# Patient Record
Sex: Male | Born: 1950
Health system: Southern US, Community
[De-identification: ages and names within clinical notes are randomized; demographics above are authoritative.]

## PROBLEM LIST (undated history)

## (undated) DIAGNOSIS — E7800 Pure hypercholesterolemia, unspecified: Secondary | ICD-10-CM

## (undated) DIAGNOSIS — R609 Edema, unspecified: Secondary | ICD-10-CM

## (undated) DIAGNOSIS — M1710 Unilateral primary osteoarthritis, unspecified knee: Secondary | ICD-10-CM

## (undated) DIAGNOSIS — R109 Unspecified abdominal pain: Secondary | ICD-10-CM

## (undated) DIAGNOSIS — K219 Gastro-esophageal reflux disease without esophagitis: Secondary | ICD-10-CM

## (undated) DIAGNOSIS — M545 Low back pain, unspecified: Secondary | ICD-10-CM

## (undated) DIAGNOSIS — K644 Residual hemorrhoidal skin tags: Secondary | ICD-10-CM

## (undated) DIAGNOSIS — K6389 Other specified diseases of intestine: Secondary | ICD-10-CM

## (undated) DIAGNOSIS — Z9989 Dependence on other enabling machines and devices: Principal | ICD-10-CM

## (undated) DIAGNOSIS — N400 Enlarged prostate without lower urinary tract symptoms: Secondary | ICD-10-CM

## (undated) DIAGNOSIS — I8310 Varicose veins of unspecified lower extremity with inflammation: Secondary | ICD-10-CM

## (undated) DIAGNOSIS — K5730 Diverticulosis of large intestine without perforation or abscess without bleeding: Secondary | ICD-10-CM

## (undated) DIAGNOSIS — IMO0002 Reserved for concepts with insufficient information to code with codable children: Secondary | ICD-10-CM

## (undated) DIAGNOSIS — L259 Unspecified contact dermatitis, unspecified cause: Secondary | ICD-10-CM

## (undated) DIAGNOSIS — G4730 Sleep apnea, unspecified: Secondary | ICD-10-CM

## (undated) DIAGNOSIS — E119 Type 2 diabetes mellitus without complications: Secondary | ICD-10-CM

## (undated) DIAGNOSIS — I10 Essential (primary) hypertension: Secondary | ICD-10-CM

## (undated) DIAGNOSIS — K589 Irritable bowel syndrome without diarrhea: Secondary | ICD-10-CM

## (undated) DIAGNOSIS — J309 Allergic rhinitis, unspecified: Secondary | ICD-10-CM

## (undated) DIAGNOSIS — F411 Generalized anxiety disorder: Secondary | ICD-10-CM

## (undated) DIAGNOSIS — G4733 Obstructive sleep apnea (adult) (pediatric): Principal | ICD-10-CM

## (undated) HISTORY — DX: Reserved for concepts with insufficient information to code with codable children: IMO0002

## (undated) HISTORY — DX: Benign prostatic hyperplasia without lower urinary tract symptoms: N40.0

## (undated) HISTORY — DX: Other specified diseases of intestine: K63.89

## (undated) HISTORY — DX: Essential (primary) hypertension: I10

## (undated) HISTORY — PX: LUMBAR LAMINECTOMY: SHX95

## (undated) HISTORY — DX: Obstructive sleep apnea (adult) (pediatric): G47.33

## (undated) HISTORY — DX: Dependence on other enabling machines and devices: Z99.89

## (undated) HISTORY — DX: Allergic rhinitis, unspecified: J30.9

## (undated) HISTORY — DX: Unspecified contact dermatitis, unspecified cause: L25.9

## (undated) HISTORY — DX: Edema, unspecified: R60.9

## (undated) HISTORY — DX: Sleep apnea, unspecified: G47.30

## (undated) HISTORY — DX: Residual hemorrhoidal skin tags: K64.4

## (undated) HISTORY — DX: Pure hypercholesterolemia, unspecified: E78.00

## (undated) HISTORY — DX: Varicose veins of unspecified lower extremity with inflammation: I83.10

## (undated) HISTORY — DX: Unilateral primary osteoarthritis, unspecified knee: M17.10

## (undated) HISTORY — DX: Generalized anxiety disorder: F41.1

## (undated) HISTORY — DX: Gastro-esophageal reflux disease without esophagitis: K21.9

## (undated) HISTORY — DX: Low back pain, unspecified: M54.50

## (undated) HISTORY — PX: APPENDECTOMY: SHX54

## (undated) HISTORY — DX: Low back pain: M54.5

## (undated) HISTORY — DX: Irritable bowel syndrome without diarrhea: K58.9

## (undated) HISTORY — DX: Type 2 diabetes mellitus without complications: E11.9

## (undated) HISTORY — DX: Diverticulosis of large intestine without perforation or abscess without bleeding: K57.30

## (undated) HISTORY — DX: Unspecified abdominal pain: R10.9

---

## 1982-01-29 HISTORY — PX: KNEE ARTHROSCOPY: SUR90

## 2003-01-30 HISTORY — PX: COLONOSCOPY W/ POLYPECTOMY: SHX1380

## 2005-10-08 ENCOUNTER — Encounter: Admission: RE | Admit: 2005-10-08 | Discharge: 2005-10-08 | Payer: Self-pay | Admitting: Orthopedic Surgery

## 2005-11-12 ENCOUNTER — Encounter: Admission: RE | Admit: 2005-11-12 | Discharge: 2005-11-12 | Payer: Self-pay | Admitting: Family Medicine

## 2012-09-26 ENCOUNTER — Encounter: Payer: Self-pay | Admitting: Neurology

## 2012-09-30 ENCOUNTER — Encounter: Payer: Self-pay | Admitting: Neurology

## 2012-09-30 ENCOUNTER — Ambulatory Visit (INDEPENDENT_AMBULATORY_CARE_PROVIDER_SITE_OTHER): Payer: Federal, State, Local not specified - PPO | Admitting: Neurology

## 2012-09-30 VITALS — BP 131/76 | HR 79 | Temp 98.5°F | Ht 73.0 in | Wt 297.0 lb

## 2012-09-30 DIAGNOSIS — G2581 Restless legs syndrome: Secondary | ICD-10-CM

## 2012-09-30 DIAGNOSIS — G4733 Obstructive sleep apnea (adult) (pediatric): Secondary | ICD-10-CM

## 2012-09-30 HISTORY — DX: Obstructive sleep apnea (adult) (pediatric): G47.33

## 2012-09-30 NOTE — Patient Instructions (Addendum)
Please continue using your CPAP regularly. While your insurance requires that you use CPAP at least 4 hours each night on 70% of the nights, I recommend, that you not skip any nights and use it throughout the night if you can. Getting used to CPAP does take time and patience and discipline. Untreated obstructive sleep apnea when it is moderate to severe can have an adverse impact on cardiovascular health and raise her risk for heart disease, arrhythmias, hypertension, congestive heart failure, stroke and diabetes. Untreated obstructive sleep apnea causes sleep disruption, nonrestorative sleep, and sleep deprivation. This can have an impact on your day to day functioning and cause daytime sleepiness and impairment of cognitive function, memory loss, mood disturbance, and problems focussing. Using CPAP regularly can improve these symptoms.  Based on your symptoms, I would recommend a repeat sleep study with a diagnostic portion in the beginning, followed by CPAP.

## 2012-09-30 NOTE — Progress Notes (Signed)
Subjective:    Patient ID: Ethan Greene is a 62 y.o. male.  HPI  Huston Foley, MD, PhD [facility] [address], [address] [address] [place], [place] [address]  Dear Harrold Donath,   I saw your patient, Ethan Greene, upon your kind request in my neurologic clinic today for initial consultation of his sleep disorder, in particular his prior diagnosis of obstructive sleep apnea. The patient is accompanied by his wife today. As you know, Ethan Greene is a very pleasant 62 year old right-handed gentleman with an underlying medical history of allergic rhinitis, reflux disease, osteoarthritis, diabetes type 2, anxiety, diverticulosis, irritable bowel syndrome, hypertension, colonic polyps, lower back pain and hyperlipidemia as well as a prior diagnosis of obstructive sleep apnea who needs reevaluation for a new machine. I reviewed his sleep study which you kindly included in your referral from 02/16/2004 which was conducted at [facility]. His RDI was 71 per hour, worse in REM sleep at 81 per hour, his mean oxygen saturation was 93.6% and O2 nadir was 68.7% and REM sleep. While I do not see a CPAP titration study report, he has been established on CPAP since then through [facility]. He did not bring his machine and I do not have compliance date for review, but he indicates using CPAP regularly. In the past 6-12 months he has had worsening daytime tiredness and non-restorative sleep. He uses medium nasal pillows, Swift FX and his pressure is 10 cm, per wife.  His typical bedtime is reported to be around 9 PM and usual wake time is around 5:30 AM. Sometimes he wakes up around 3 AM and cannot go back to sleep. Sleep onset typically occurs within 15-30 minutes. He reports feeling adequately rested upon awakening. He wakes up on an average 0 to 1 times in the middle of the night and has to go to the bathroom 0 to 1 times on a typical night. He admits to occasional morning headaches.  His wife has noticed some recent forgetfulness. He has not had any problems performing at work. He works day shift. 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 He reports excessive daytime somnolence (EDS) and His Epworth Sleepiness Score (ESS) is 11/24 today. He has not fallen asleep while driving. The patient has not been taking a planned nap, but in the last 3-6 months, has been napping on the weekend. He denies dreaming in a nap and reports feeling refreshed after a nap.  He has been known to snore for the past many years. Snoring is reportedly marked, and associated with choking sounds and witnessed apneas, but not so much with CPAP. The patient admits to a sense of choking or strangling feeling without CPAP. There is no report of nighttime reflux, with no nighttime cough experienced. The patient has noted some RLS symptoms and is known to kick while asleep or before falling asleep. He has significant arthritis pain in his R shoulder, L hip and knees, R more than L. Because of his arthritis pain he prefers to sleep on the left side. There is family history of OSA in his son and also suspected in his father as he was a loud snorer.  He is a restless sleeper and in the morning, the bed is quite disheveled.   He denies cataplexy, sleep paralysis, hypnagogic or hypnopompic hallucinations, or sleep attacks. He does report some vivid dreams, and rare nightmares, but no dream enactments, or parasomnias, such as sleep talking or sleep  walking.   He consumes 1 caffeinated beverage per day, usually in the form of coffee in the morning.  His bedroom is usually dark and cool. There is TV in the bedroom and usually it is not on at night. His wife turns it off after he is asleep.   His Past Medical History Is Significant For: Past Medical History  Diagnosis Date  . Contact dermatitis and other eczema, due to unspecified cause   . Edema   . Allergic rhinitis, cause unspecified   . Esophageal reflux    . Abdominal pain, unspecified site   . Osteoarthrosis, unspecified whether generalized or localized, lower leg   . Varicose veins of lower extremities with inflammation   . Type II or unspecified type diabetes mellitus without mention of complication, not stated as uncontrolled   . Diverticulosis of colon (without mention of hemorrhage)   . Anxiety state, unspecified   . External hemorrhoids without mention of complication   . Irritable bowel syndrome   . Essential hypertension, benign   . Other specified disorder of intestines   . Unspecified sleep apnea   . Hypertrophy of prostate without urinary obstruction and other lower urinary tract symptoms (LUTS)   . Lumbago   . Pure hypercholesterolemia   . OSA on CPAP 09/30/2012    His Past Surgical History Is Significant For: Past Surgical History  Procedure Laterality Date  . Knee arthroscopy Right 1984  . Lumbar laminectomy  1992 & 1996  . Colonoscopy w/ polypectomy  2005  . Appendectomy      His Family History Is Significant For: Family History  Problem Relation Age of Onset  . Cancer Father   . Cancer Mother     His Social History Is Significant For: History   Social History  . Marital Status: Single    Spouse Name: N/A    Number of Children: N/A  . Years of Education: N/A   Social History Main Topics  . Smoking status: Former Smoker    Types: Cigarettes    Quit date: 09/27/2007  . Smokeless tobacco: None  . Alcohol Use: 1.5 oz/week    3 drink(s) per week  . Drug Use: No  . Sexual Activity: None   Other Topics Concern  . None   Social History Narrative  . None    His Allergies Are:  Allergies  Allergen Reactions  . Aspirin   :   His Current Medications Are:  Outpatient Encounter Prescriptions as of 09/30/2012  Medication Sig Dispense Refill  . atorvastatin (LIPITOR) 10 MG tablet Take 10 mg by mouth daily.      . fish oil-omega-3 fatty acids 1000 MG capsule Take 1 g by mouth daily.      . fluocinonide  ointment (LIDEX) 0.05 % Apply 1 application topically 2 (two) times daily.      . hydrochlorothiazide (HYDRODIURIL) 25 MG tablet Take 25 mg by mouth daily.      Marland Kitchen HYDROcodone-acetaminophen (VICODIN) 5-500 MG per tablet Take 1 tablet by mouth every 6 (six) hours as needed for pain.      Marland Kitchen lisinopril (PRINIVIL,ZESTRIL) 5 MG tablet Take 2.5 mg by mouth daily.      . methocarbamol (ROBAXIN) 500 MG tablet Take 500 mg by mouth 4 (four) times daily.      . pimecrolimus (ELIDEL) 1 % cream Apply 1 application topically 2 (two) times daily.      . tamsulosin (FLOMAX) 0.4 MG CAPS capsule Take 0.4 mg by mouth daily.      Marland Kitchen  cyclobenzaprine (FLEXERIL) 10 MG tablet Take 10 mg by mouth 3 (three) times daily as needed for muscle spasms.       No facility-administered encounter medications on file as of 09/30/2012.    Review of Systems  Constitutional: Positive for fatigue and unexpected weight change.  HENT: Positive for hearing loss.   Respiratory:       Snoring  Musculoskeletal: Positive for arthralgias.  Neurological: Positive for headaches.       Memory loss  Psychiatric/Behavioral: Positive for sleep disturbance. The patient is nervous/anxious.     Objective:  Neurologic Exam  Physical Exam Physical Examination:   Filed Vitals:   09/30/12 0847  BP: 131/76  Pulse: 79  Temp: 98.5 F (36.9 C)    General Examination: The patient is a very pleasant 62 y.o. male in no acute distress. He appears well-developed and well-nourished and well groomed. He is obese.  HEENT: Normocephalic, atraumatic, pupils are equal, round and reactive to light and accommodation. Funduscopic exam is normal with sharp disc margins noted. Extraocular tracking is good without limitation to gaze excursion or nystagmus noted. Normal smooth pursuit is noted. Hearing is grossly intact but he reports hearing loss and he actually has hearing aids but does not typically use them. Tympanic membranes are clear bilaterally. Face is  symmetric with normal facial animation and normal facial sensation. Speech is clear with no dysarthria noted. There is no hypophonia. There is no lip, neck/head, jaw or voice tremor. Neck is supple with full range of passive and active motion. There are no carotid bruits on auscultation. Oropharynx exam reveals: mild mouth dryness, adequate dental hygiene and moderate airway crowding, due to narrow airway, larger tongue and tonsillar size of 1+. Mallampati is class II. Tongue protrudes centrally and palate elevates symmetrically. Neck size is 18.25 inches.   Chest: Clear to auscultation without wheezing, rhonchi or crackles noted.  Heart: S1+S2+0, regular and normal without murmurs, rubs or gallops noted.   Abdomen: Soft, non-tender and non-distended with normal bowel sounds appreciated on auscultation.  Extremities: There is trace pitting edema in the distal right lower extremity. Pedal pulses are intact.  Skin: Warm and dry without trophic changes noted. There are no varicose veins.  Musculoskeletal: exam reveals no obvious joint deformities, tenderness or joint swelling or erythema.   Neurologically:  Mental status: The patient is awake, alert and oriented in all 4 spheres. His memory, attention, language and knowledge are appropriate. There is no aphasia, agnosia, apraxia or anomia. Speech is clear with normal prosody and enunciation. Thought process is linear. Mood is congruent and affect is normal.  Cranial nerves are as described above under HEENT exam. In addition, shoulder shrug is normal with equal shoulder height noted. Motor exam: Normal bulk, strength and tone is noted. He has some pain limitation of his range of motion in the right shoulder. There is no drift, tremor or rebound. Romberg is negative. Reflexes are 2+ throughout, with the exception of trace in his right knee. This knee was operated on. Fine motor skills are intact with normal finger taps, normal hand movements, normal  rapid alternating patting, normal foot taps and normal foot agility.  Cerebellar testing shows no dysmetria or intention tremor on finger to nose testing. Heel to shin is unremarkable bilaterally. There is no truncal or gait ataxia.  Sensory exam is intact to light touch, pinprick, vibration, temperature sense and proprioception in the upper and lower extremities.  Gait, station and balance are unremarkable. No veering to one  side is noted. No leaning to one side is noted. Posture is age-appropriate and stance is narrow based. No problems turning are noted. He turns en bloc. Tandem walk is unremarkable. Intact toe and heel stance is noted.               Assessment and Plan:   In summary, Ethan Greene is a very pleasant 62 y.o.-year old male with a history of obstructive sleep apnea on CPAP. His sleep study was about 8 years ago. He is reportedly on a pressure of 10 cm but reports recurrence of nonrestorative sleep and daytime tiredness in the past 6-12 months. He does indicate some weight gain by the way, in the realm of 5-6 pounds perhaps since the last sleep study. Since it has been several years since he was last evaluated I would like to go ahead and proceed with a split-night sleep study to reassess the severity of his OSA and also to re-titrate his CPAP. He was in agreement. He understands the importance of treating severe obstructive sleep apnea in particular, to reduce cardiovascular risk factors. I will see him back after his sleep study is completed. If there is a change in this setting or the mask we will go ahead and make those changes in his prescription. His current CPAP machine is over 49 years old. He may be eligible for a new machine as well. I explained the importance of being compliant with PAP treatment, not only for insurance purposes but primarily for the patient's long term health benefit. I answered all their questions today and the patient and his wife were in agreement.  Thank you  very much for allowing me to participate in the care of this nice patient. If I can be of any further assistance to you please do not hesitate to call me at [phone].  Sincerely,   Huston Foley, MD, PhD

## 2012-10-15 ENCOUNTER — Ambulatory Visit (INDEPENDENT_AMBULATORY_CARE_PROVIDER_SITE_OTHER): Payer: Federal, State, Local not specified - PPO | Admitting: Neurology

## 2012-10-15 DIAGNOSIS — R9431 Abnormal electrocardiogram [ECG] [EKG]: Secondary | ICD-10-CM

## 2012-10-15 DIAGNOSIS — G4733 Obstructive sleep apnea (adult) (pediatric): Secondary | ICD-10-CM

## 2012-10-16 ENCOUNTER — Telehealth: Payer: Self-pay | Admitting: *Deleted

## 2012-10-16 NOTE — Telephone Encounter (Signed)
Spoke to patient's spouse as patient was not at home.  I reassured her that I had reviewed his test and we did see enough sleep for valuable information.  During the first half of the test he slept for 135 minutes, during the second half of the test, he slept 247 minutes.  I explained that he experienced an inordinate amount of stage one sleep during the first half of the test and explained that may contribute to his perception of no sleep.  I also let her know he experienced a good degree of wake during both parts of the test and that can contribute to feeling like one hasn't slept at all.  Asked her to have him call me if he had further questions/concerns.

## 2012-10-16 NOTE — Telephone Encounter (Signed)
Message copied by Daryll Drown on Thu Oct 16, 2012 10:19 AM ------      Message from: [employer]      Created: Thu Oct 16, 2012 10:06 AM      Regarding: Return call       Pt had a sleep study last night and had very little to no sleep at all due to noise.  Please return call to Mrs. Trowbridge asap.  She would like to know what this means for him. [phone]       ------

## 2012-10-29 ENCOUNTER — Telehealth: Payer: Self-pay | Admitting: Neurology

## 2012-10-29 DIAGNOSIS — G4733 Obstructive sleep apnea (adult) (pediatric): Secondary | ICD-10-CM

## 2012-10-29 NOTE — Telephone Encounter (Signed)
Please call and inform patient that I have entered an order for treatment with CPAP. We will arrange for a machine through his DME company and I will see the patient back in follow-up in about 6-8 weeks. I will be looking out for compliance data downloaded from the machine at the time of the followup appointment and discuss how it is going with PAP treatment at the time of the visit. Please also make sure, the patient has a follow-up appointment with me in 6-8 weeks from the setup date, thanks.   His original pressure was 10 cm, but he did very well on 8 cm for [place] and my new order for CPAP is for 8 cm. He should be able to get a new machine.   Huston Foley, MD, PhD [facility])

## 2012-10-30 ENCOUNTER — Encounter: Payer: Self-pay | Admitting: *Deleted

## 2012-10-30 NOTE — Telephone Encounter (Signed)
Left message for patient regarding sleep study results, asked patient to call me back to discuss results and have questions answered.  Explained that a copy of the sleep study was sent to referring physician and copy of study is coming to them in the mail. Gave detailed message that Dr. Frances Furbish felt like patient needed different settings so orders would be sent to his home care equipment company [employer] [facility] and he should hear from them soon.  Copy of report faxed to Lonie Peak PA-C. Explained that a follow up appt was needed 6-8 weeks after he received new CPAP or new settings in order to assess that he is doing well at this pressure and using equipment with compliance. A copy of the sleep study interpretive report as well as a letter with info regarding contact info for the DME company, the importance of CPAP compliance, and the need for patient to call and set the follow up appointment in 6-8 weeks after starting CPAP will be mailed to the patient's home.

## 2014-12-17 ENCOUNTER — Other Ambulatory Visit: Payer: Self-pay | Admitting: Urology

## 2014-12-17 DIAGNOSIS — N281 Cyst of kidney, acquired: Secondary | ICD-10-CM

## 2014-12-28 ENCOUNTER — Ambulatory Visit (HOSPITAL_COMMUNITY)
Admission: RE | Admit: 2014-12-28 | Discharge: 2014-12-28 | Disposition: A | Discharge status: Home / Self Care | Payer: Federal, State, Local not specified - PPO | Source: Ambulatory Visit | Attending: Urology | Admitting: Urology

## 2014-12-28 DIAGNOSIS — I700 Atherosclerosis of aorta: Secondary | ICD-10-CM | POA: Insufficient documentation

## 2014-12-28 DIAGNOSIS — I714 Abdominal aortic aneurysm, without rupture: Secondary | ICD-10-CM | POA: Insufficient documentation

## 2014-12-28 DIAGNOSIS — N281 Cyst of kidney, acquired: Secondary | ICD-10-CM

## 2014-12-28 DIAGNOSIS — K760 Fatty (change of) liver, not elsewhere classified: Secondary | ICD-10-CM | POA: Insufficient documentation

## 2014-12-28 MED ORDER — GADOBENATE DIMEGLUMINE 529 MG/ML IV SOLN
20.0000 mL | Freq: Once | INTRAVENOUS | Status: AC | PRN
  Administered 2014-12-28: 20 mL via INTRAVENOUS

## 2016-05-04 ENCOUNTER — Other Ambulatory Visit: Payer: Self-pay | Admitting: Physician Assistant

## 2016-05-04 DIAGNOSIS — R748 Abnormal levels of other serum enzymes: Secondary | ICD-10-CM

## 2016-05-11 ENCOUNTER — Ambulatory Visit
Admission: RE | Admit: 2016-05-11 | Discharge: 2016-05-11 | Disposition: A | Discharge status: Home / Self Care | Payer: Federal, State, Local not specified - PPO | Source: Ambulatory Visit | Attending: Physician Assistant | Admitting: Physician Assistant

## 2016-05-11 DIAGNOSIS — R748 Abnormal levels of other serum enzymes: Secondary | ICD-10-CM

## 2016-10-31 DIAGNOSIS — M546 Pain in thoracic spine: Secondary | ICD-10-CM | POA: Diagnosis not present

## 2016-10-31 DIAGNOSIS — M62830 Muscle spasm of back: Secondary | ICD-10-CM | POA: Diagnosis not present

## 2016-10-31 DIAGNOSIS — M5413 Radiculopathy, cervicothoracic region: Secondary | ICD-10-CM | POA: Diagnosis not present

## 2016-10-31 DIAGNOSIS — M9901 Segmental and somatic dysfunction of cervical region: Secondary | ICD-10-CM | POA: Diagnosis not present

## 2016-10-31 DIAGNOSIS — M5124 Other intervertebral disc displacement, thoracic region: Secondary | ICD-10-CM | POA: Diagnosis not present

## 2016-10-31 DIAGNOSIS — M9902 Segmental and somatic dysfunction of thoracic region: Secondary | ICD-10-CM | POA: Diagnosis not present

## 2016-10-31 DIAGNOSIS — M50323 Other cervical disc degeneration at C6-C7 level: Secondary | ICD-10-CM | POA: Diagnosis not present

## 2016-11-22 DIAGNOSIS — E7800 Pure hypercholesterolemia, unspecified: Secondary | ICD-10-CM | POA: Diagnosis not present

## 2016-11-22 DIAGNOSIS — Z9181 History of falling: Secondary | ICD-10-CM | POA: Diagnosis not present

## 2016-11-22 DIAGNOSIS — I10 Essential (primary) hypertension: Secondary | ICD-10-CM | POA: Diagnosis not present

## 2016-11-22 DIAGNOSIS — E119 Type 2 diabetes mellitus without complications: Secondary | ICD-10-CM | POA: Diagnosis not present

## 2016-11-22 DIAGNOSIS — Z1389 Encounter for screening for other disorder: Secondary | ICD-10-CM | POA: Diagnosis not present

## 2016-11-22 DIAGNOSIS — N400 Enlarged prostate without lower urinary tract symptoms: Secondary | ICD-10-CM | POA: Diagnosis not present

## 2016-11-22 DIAGNOSIS — M5416 Radiculopathy, lumbar region: Secondary | ICD-10-CM | POA: Diagnosis not present

## 2016-11-22 DIAGNOSIS — Z79899 Other long term (current) drug therapy: Secondary | ICD-10-CM | POA: Diagnosis not present

## 2016-12-13 DIAGNOSIS — M5413 Radiculopathy, cervicothoracic region: Secondary | ICD-10-CM | POA: Diagnosis not present

## 2016-12-13 DIAGNOSIS — M62830 Muscle spasm of back: Secondary | ICD-10-CM | POA: Diagnosis not present

## 2016-12-13 DIAGNOSIS — M50323 Other cervical disc degeneration at C6-C7 level: Secondary | ICD-10-CM | POA: Diagnosis not present

## 2016-12-13 DIAGNOSIS — M5124 Other intervertebral disc displacement, thoracic region: Secondary | ICD-10-CM | POA: Diagnosis not present

## 2016-12-13 DIAGNOSIS — M9902 Segmental and somatic dysfunction of thoracic region: Secondary | ICD-10-CM | POA: Diagnosis not present

## 2016-12-13 DIAGNOSIS — M546 Pain in thoracic spine: Secondary | ICD-10-CM | POA: Diagnosis not present

## 2016-12-13 DIAGNOSIS — M9901 Segmental and somatic dysfunction of cervical region: Secondary | ICD-10-CM | POA: Diagnosis not present

## 2016-12-31 DIAGNOSIS — J069 Acute upper respiratory infection, unspecified: Secondary | ICD-10-CM | POA: Diagnosis not present

## 2017-01-16 DIAGNOSIS — M9901 Segmental and somatic dysfunction of cervical region: Secondary | ICD-10-CM | POA: Diagnosis not present

## 2017-01-16 DIAGNOSIS — M9902 Segmental and somatic dysfunction of thoracic region: Secondary | ICD-10-CM | POA: Diagnosis not present

## 2017-01-16 DIAGNOSIS — M5413 Radiculopathy, cervicothoracic region: Secondary | ICD-10-CM | POA: Diagnosis not present

## 2017-01-16 DIAGNOSIS — M5124 Other intervertebral disc displacement, thoracic region: Secondary | ICD-10-CM | POA: Diagnosis not present

## 2017-01-16 DIAGNOSIS — M546 Pain in thoracic spine: Secondary | ICD-10-CM | POA: Diagnosis not present

## 2017-01-16 DIAGNOSIS — M62830 Muscle spasm of back: Secondary | ICD-10-CM | POA: Diagnosis not present

## 2017-01-16 DIAGNOSIS — M50323 Other cervical disc degeneration at C6-C7 level: Secondary | ICD-10-CM | POA: Diagnosis not present

## 2017-02-14 DIAGNOSIS — R351 Nocturia: Secondary | ICD-10-CM | POA: Diagnosis not present

## 2017-02-14 DIAGNOSIS — N281 Cyst of kidney, acquired: Secondary | ICD-10-CM | POA: Diagnosis not present

## 2017-02-14 DIAGNOSIS — R310 Gross hematuria: Secondary | ICD-10-CM | POA: Diagnosis not present

## 2017-02-14 DIAGNOSIS — N401 Enlarged prostate with lower urinary tract symptoms: Secondary | ICD-10-CM | POA: Diagnosis not present

## 2017-02-20 DIAGNOSIS — M5413 Radiculopathy, cervicothoracic region: Secondary | ICD-10-CM | POA: Diagnosis not present

## 2017-02-20 DIAGNOSIS — M9901 Segmental and somatic dysfunction of cervical region: Secondary | ICD-10-CM | POA: Diagnosis not present

## 2017-02-20 DIAGNOSIS — M5124 Other intervertebral disc displacement, thoracic region: Secondary | ICD-10-CM | POA: Diagnosis not present

## 2017-02-20 DIAGNOSIS — M62830 Muscle spasm of back: Secondary | ICD-10-CM | POA: Diagnosis not present

## 2017-02-20 DIAGNOSIS — M50323 Other cervical disc degeneration at C6-C7 level: Secondary | ICD-10-CM | POA: Diagnosis not present

## 2017-02-20 DIAGNOSIS — M546 Pain in thoracic spine: Secondary | ICD-10-CM | POA: Diagnosis not present

## 2017-02-20 DIAGNOSIS — M9902 Segmental and somatic dysfunction of thoracic region: Secondary | ICD-10-CM | POA: Diagnosis not present

## 2017-02-27 DIAGNOSIS — Z23 Encounter for immunization: Secondary | ICD-10-CM | POA: Diagnosis not present

## 2017-02-27 DIAGNOSIS — Z1331 Encounter for screening for depression: Secondary | ICD-10-CM | POA: Diagnosis not present

## 2017-02-27 DIAGNOSIS — E119 Type 2 diabetes mellitus without complications: Secondary | ICD-10-CM | POA: Diagnosis not present

## 2017-02-27 DIAGNOSIS — E7800 Pure hypercholesterolemia, unspecified: Secondary | ICD-10-CM | POA: Diagnosis not present

## 2017-02-27 DIAGNOSIS — M5416 Radiculopathy, lumbar region: Secondary | ICD-10-CM | POA: Diagnosis not present

## 2017-02-27 DIAGNOSIS — Z9181 History of falling: Secondary | ICD-10-CM | POA: Diagnosis not present

## 2017-02-27 DIAGNOSIS — I10 Essential (primary) hypertension: Secondary | ICD-10-CM | POA: Diagnosis not present

## 2017-02-27 DIAGNOSIS — N400 Enlarged prostate without lower urinary tract symptoms: Secondary | ICD-10-CM | POA: Diagnosis not present

## 2017-03-05 DIAGNOSIS — Z7984 Long term (current) use of oral hypoglycemic drugs: Secondary | ICD-10-CM | POA: Diagnosis not present

## 2017-03-05 DIAGNOSIS — H2513 Age-related nuclear cataract, bilateral: Secondary | ICD-10-CM | POA: Diagnosis not present

## 2017-03-05 DIAGNOSIS — E119 Type 2 diabetes mellitus without complications: Secondary | ICD-10-CM | POA: Diagnosis not present

## 2017-03-21 DIAGNOSIS — M62830 Muscle spasm of back: Secondary | ICD-10-CM | POA: Diagnosis not present

## 2017-03-21 DIAGNOSIS — M9902 Segmental and somatic dysfunction of thoracic region: Secondary | ICD-10-CM | POA: Diagnosis not present

## 2017-03-21 DIAGNOSIS — M50323 Other cervical disc degeneration at C6-C7 level: Secondary | ICD-10-CM | POA: Diagnosis not present

## 2017-03-21 DIAGNOSIS — M5124 Other intervertebral disc displacement, thoracic region: Secondary | ICD-10-CM | POA: Diagnosis not present

## 2017-03-21 DIAGNOSIS — M5413 Radiculopathy, cervicothoracic region: Secondary | ICD-10-CM | POA: Diagnosis not present

## 2017-03-21 DIAGNOSIS — M9901 Segmental and somatic dysfunction of cervical region: Secondary | ICD-10-CM | POA: Diagnosis not present

## 2017-03-21 DIAGNOSIS — M546 Pain in thoracic spine: Secondary | ICD-10-CM | POA: Diagnosis not present

## 2017-04-22 DIAGNOSIS — M50323 Other cervical disc degeneration at C6-C7 level: Secondary | ICD-10-CM | POA: Diagnosis not present

## 2017-04-22 DIAGNOSIS — M5124 Other intervertebral disc displacement, thoracic region: Secondary | ICD-10-CM | POA: Diagnosis not present

## 2017-04-22 DIAGNOSIS — M546 Pain in thoracic spine: Secondary | ICD-10-CM | POA: Diagnosis not present

## 2017-04-22 DIAGNOSIS — M9901 Segmental and somatic dysfunction of cervical region: Secondary | ICD-10-CM | POA: Diagnosis not present

## 2017-04-22 DIAGNOSIS — M9902 Segmental and somatic dysfunction of thoracic region: Secondary | ICD-10-CM | POA: Diagnosis not present

## 2017-04-22 DIAGNOSIS — M5413 Radiculopathy, cervicothoracic region: Secondary | ICD-10-CM | POA: Diagnosis not present

## 2017-04-22 DIAGNOSIS — M62830 Muscle spasm of back: Secondary | ICD-10-CM | POA: Diagnosis not present

## 2017-05-29 DIAGNOSIS — E119 Type 2 diabetes mellitus without complications: Secondary | ICD-10-CM | POA: Diagnosis not present

## 2017-05-29 DIAGNOSIS — Z79899 Other long term (current) drug therapy: Secondary | ICD-10-CM | POA: Diagnosis not present

## 2017-05-29 DIAGNOSIS — N400 Enlarged prostate without lower urinary tract symptoms: Secondary | ICD-10-CM | POA: Diagnosis not present

## 2017-05-29 DIAGNOSIS — E7800 Pure hypercholesterolemia, unspecified: Secondary | ICD-10-CM | POA: Diagnosis not present

## 2017-05-29 DIAGNOSIS — I10 Essential (primary) hypertension: Secondary | ICD-10-CM | POA: Diagnosis not present

## 2017-05-31 DIAGNOSIS — M5413 Radiculopathy, cervicothoracic region: Secondary | ICD-10-CM | POA: Diagnosis not present

## 2017-05-31 DIAGNOSIS — M50323 Other cervical disc degeneration at C6-C7 level: Secondary | ICD-10-CM | POA: Diagnosis not present

## 2017-05-31 DIAGNOSIS — M5124 Other intervertebral disc displacement, thoracic region: Secondary | ICD-10-CM | POA: Diagnosis not present

## 2017-05-31 DIAGNOSIS — M546 Pain in thoracic spine: Secondary | ICD-10-CM | POA: Diagnosis not present

## 2017-05-31 DIAGNOSIS — M9901 Segmental and somatic dysfunction of cervical region: Secondary | ICD-10-CM | POA: Diagnosis not present

## 2017-05-31 DIAGNOSIS — M62830 Muscle spasm of back: Secondary | ICD-10-CM | POA: Diagnosis not present

## 2017-05-31 DIAGNOSIS — M9902 Segmental and somatic dysfunction of thoracic region: Secondary | ICD-10-CM | POA: Diagnosis not present

## 2017-07-10 DIAGNOSIS — M9902 Segmental and somatic dysfunction of thoracic region: Secondary | ICD-10-CM | POA: Diagnosis not present

## 2017-07-10 DIAGNOSIS — M9901 Segmental and somatic dysfunction of cervical region: Secondary | ICD-10-CM | POA: Diagnosis not present

## 2017-07-10 DIAGNOSIS — M5124 Other intervertebral disc displacement, thoracic region: Secondary | ICD-10-CM | POA: Diagnosis not present

## 2017-07-10 DIAGNOSIS — M546 Pain in thoracic spine: Secondary | ICD-10-CM | POA: Diagnosis not present

## 2017-07-10 DIAGNOSIS — M5413 Radiculopathy, cervicothoracic region: Secondary | ICD-10-CM | POA: Diagnosis not present

## 2017-07-10 DIAGNOSIS — M62830 Muscle spasm of back: Secondary | ICD-10-CM | POA: Diagnosis not present

## 2017-07-10 DIAGNOSIS — M50323 Other cervical disc degeneration at C6-C7 level: Secondary | ICD-10-CM | POA: Diagnosis not present

## 2017-08-15 DIAGNOSIS — M62830 Muscle spasm of back: Secondary | ICD-10-CM | POA: Diagnosis not present

## 2017-08-15 DIAGNOSIS — M5124 Other intervertebral disc displacement, thoracic region: Secondary | ICD-10-CM | POA: Diagnosis not present

## 2017-08-15 DIAGNOSIS — M50323 Other cervical disc degeneration at C6-C7 level: Secondary | ICD-10-CM | POA: Diagnosis not present

## 2017-08-15 DIAGNOSIS — M9902 Segmental and somatic dysfunction of thoracic region: Secondary | ICD-10-CM | POA: Diagnosis not present

## 2017-08-15 DIAGNOSIS — M9901 Segmental and somatic dysfunction of cervical region: Secondary | ICD-10-CM | POA: Diagnosis not present

## 2017-08-15 DIAGNOSIS — M546 Pain in thoracic spine: Secondary | ICD-10-CM | POA: Diagnosis not present

## 2017-08-15 DIAGNOSIS — M5413 Radiculopathy, cervicothoracic region: Secondary | ICD-10-CM | POA: Diagnosis not present

## 2017-08-19 DIAGNOSIS — R310 Gross hematuria: Secondary | ICD-10-CM | POA: Diagnosis not present

## 2017-08-19 DIAGNOSIS — N401 Enlarged prostate with lower urinary tract symptoms: Secondary | ICD-10-CM | POA: Diagnosis not present

## 2017-08-19 DIAGNOSIS — R351 Nocturia: Secondary | ICD-10-CM | POA: Diagnosis not present

## 2017-08-29 DIAGNOSIS — E119 Type 2 diabetes mellitus without complications: Secondary | ICD-10-CM | POA: Diagnosis not present

## 2017-08-29 DIAGNOSIS — Z1339 Encounter for screening examination for other mental health and behavioral disorders: Secondary | ICD-10-CM | POA: Diagnosis not present

## 2017-08-29 DIAGNOSIS — I10 Essential (primary) hypertension: Secondary | ICD-10-CM | POA: Diagnosis not present

## 2017-08-29 DIAGNOSIS — M5416 Radiculopathy, lumbar region: Secondary | ICD-10-CM | POA: Diagnosis not present

## 2017-08-29 DIAGNOSIS — E7800 Pure hypercholesterolemia, unspecified: Secondary | ICD-10-CM | POA: Diagnosis not present

## 2017-08-29 DIAGNOSIS — Z79899 Other long term (current) drug therapy: Secondary | ICD-10-CM | POA: Diagnosis not present

## 2017-09-11 DIAGNOSIS — M5124 Other intervertebral disc displacement, thoracic region: Secondary | ICD-10-CM | POA: Diagnosis not present

## 2017-09-11 DIAGNOSIS — M50323 Other cervical disc degeneration at C6-C7 level: Secondary | ICD-10-CM | POA: Diagnosis not present

## 2017-09-11 DIAGNOSIS — M9901 Segmental and somatic dysfunction of cervical region: Secondary | ICD-10-CM | POA: Diagnosis not present

## 2017-09-11 DIAGNOSIS — M546 Pain in thoracic spine: Secondary | ICD-10-CM | POA: Diagnosis not present

## 2017-09-11 DIAGNOSIS — M9902 Segmental and somatic dysfunction of thoracic region: Secondary | ICD-10-CM | POA: Diagnosis not present

## 2017-09-11 DIAGNOSIS — M62830 Muscle spasm of back: Secondary | ICD-10-CM | POA: Diagnosis not present

## 2017-09-11 DIAGNOSIS — M5413 Radiculopathy, cervicothoracic region: Secondary | ICD-10-CM | POA: Diagnosis not present

## 2017-09-27 DIAGNOSIS — G4733 Obstructive sleep apnea (adult) (pediatric): Secondary | ICD-10-CM | POA: Diagnosis not present

## 2017-10-14 DIAGNOSIS — S0990XA Unspecified injury of head, initial encounter: Secondary | ICD-10-CM | POA: Diagnosis not present

## 2017-10-14 DIAGNOSIS — Z7984 Long term (current) use of oral hypoglycemic drugs: Secondary | ICD-10-CM | POA: Diagnosis not present

## 2017-10-14 DIAGNOSIS — S199XXA Unspecified injury of neck, initial encounter: Secondary | ICD-10-CM | POA: Diagnosis not present

## 2017-10-14 DIAGNOSIS — E119 Type 2 diabetes mellitus without complications: Secondary | ICD-10-CM | POA: Diagnosis not present

## 2017-10-14 DIAGNOSIS — M1990 Unspecified osteoarthritis, unspecified site: Secondary | ICD-10-CM | POA: Diagnosis not present

## 2017-10-14 DIAGNOSIS — S42032A Displaced fracture of lateral end of left clavicle, initial encounter for closed fracture: Secondary | ICD-10-CM | POA: Diagnosis not present

## 2017-10-14 DIAGNOSIS — Z79899 Other long term (current) drug therapy: Secondary | ICD-10-CM | POA: Diagnosis not present

## 2017-10-14 DIAGNOSIS — S42035A Nondisplaced fracture of lateral end of left clavicle, initial encounter for closed fracture: Secondary | ICD-10-CM | POA: Diagnosis not present

## 2017-10-14 DIAGNOSIS — I10 Essential (primary) hypertension: Secondary | ICD-10-CM | POA: Diagnosis not present

## 2017-10-14 DIAGNOSIS — S0003XA Contusion of scalp, initial encounter: Secondary | ICD-10-CM | POA: Diagnosis not present

## 2017-10-14 DIAGNOSIS — Z982 Presence of cerebrospinal fluid drainage device: Secondary | ICD-10-CM | POA: Diagnosis not present

## 2017-10-16 DIAGNOSIS — S42032A Displaced fracture of lateral end of left clavicle, initial encounter for closed fracture: Secondary | ICD-10-CM | POA: Diagnosis not present

## 2017-10-30 DIAGNOSIS — L918 Other hypertrophic disorders of the skin: Secondary | ICD-10-CM | POA: Diagnosis not present

## 2017-11-13 DIAGNOSIS — S42002D Fracture of unspecified part of left clavicle, subsequent encounter for fracture with routine healing: Secondary | ICD-10-CM | POA: Diagnosis not present

## 2017-12-03 DIAGNOSIS — Z23 Encounter for immunization: Secondary | ICD-10-CM | POA: Diagnosis not present

## 2017-12-03 DIAGNOSIS — I10 Essential (primary) hypertension: Secondary | ICD-10-CM | POA: Diagnosis not present

## 2017-12-03 DIAGNOSIS — E7800 Pure hypercholesterolemia, unspecified: Secondary | ICD-10-CM | POA: Diagnosis not present

## 2017-12-03 DIAGNOSIS — N400 Enlarged prostate without lower urinary tract symptoms: Secondary | ICD-10-CM | POA: Diagnosis not present

## 2017-12-03 DIAGNOSIS — Z79899 Other long term (current) drug therapy: Secondary | ICD-10-CM | POA: Diagnosis not present

## 2017-12-03 DIAGNOSIS — E119 Type 2 diabetes mellitus without complications: Secondary | ICD-10-CM | POA: Diagnosis not present

## 2017-12-11 DIAGNOSIS — S42002D Fracture of unspecified part of left clavicle, subsequent encounter for fracture with routine healing: Secondary | ICD-10-CM | POA: Diagnosis not present

## 2018-01-15 DIAGNOSIS — M25562 Pain in left knee: Secondary | ICD-10-CM | POA: Diagnosis not present

## 2018-01-15 DIAGNOSIS — S42002D Fracture of unspecified part of left clavicle, subsequent encounter for fracture with routine healing: Secondary | ICD-10-CM | POA: Diagnosis not present

## 2018-02-17 DIAGNOSIS — M9901 Segmental and somatic dysfunction of cervical region: Secondary | ICD-10-CM | POA: Diagnosis not present

## 2018-02-17 DIAGNOSIS — M546 Pain in thoracic spine: Secondary | ICD-10-CM | POA: Diagnosis not present

## 2018-02-17 DIAGNOSIS — M5413 Radiculopathy, cervicothoracic region: Secondary | ICD-10-CM | POA: Diagnosis not present

## 2018-02-17 DIAGNOSIS — M62830 Muscle spasm of back: Secondary | ICD-10-CM | POA: Diagnosis not present

## 2018-02-17 DIAGNOSIS — M5124 Other intervertebral disc displacement, thoracic region: Secondary | ICD-10-CM | POA: Diagnosis not present

## 2018-02-17 DIAGNOSIS — M50323 Other cervical disc degeneration at C6-C7 level: Secondary | ICD-10-CM | POA: Diagnosis not present

## 2018-02-17 DIAGNOSIS — M9902 Segmental and somatic dysfunction of thoracic region: Secondary | ICD-10-CM | POA: Diagnosis not present

## 2018-02-28 DIAGNOSIS — Z6838 Body mass index (BMI) 38.0-38.9, adult: Secondary | ICD-10-CM | POA: Diagnosis not present

## 2018-02-28 DIAGNOSIS — R05 Cough: Secondary | ICD-10-CM | POA: Diagnosis not present

## 2018-03-05 DIAGNOSIS — Z1331 Encounter for screening for depression: Secondary | ICD-10-CM | POA: Diagnosis not present

## 2018-03-05 DIAGNOSIS — N400 Enlarged prostate without lower urinary tract symptoms: Secondary | ICD-10-CM | POA: Diagnosis not present

## 2018-03-05 DIAGNOSIS — I10 Essential (primary) hypertension: Secondary | ICD-10-CM | POA: Diagnosis not present

## 2018-03-05 DIAGNOSIS — E7800 Pure hypercholesterolemia, unspecified: Secondary | ICD-10-CM | POA: Diagnosis not present

## 2018-03-05 DIAGNOSIS — E119 Type 2 diabetes mellitus without complications: Secondary | ICD-10-CM | POA: Diagnosis not present

## 2018-03-05 DIAGNOSIS — Z9181 History of falling: Secondary | ICD-10-CM | POA: Diagnosis not present

## 2018-03-11 DIAGNOSIS — M50323 Other cervical disc degeneration at C6-C7 level: Secondary | ICD-10-CM | POA: Diagnosis not present

## 2018-03-11 DIAGNOSIS — M62830 Muscle spasm of back: Secondary | ICD-10-CM | POA: Diagnosis not present

## 2018-03-11 DIAGNOSIS — M546 Pain in thoracic spine: Secondary | ICD-10-CM | POA: Diagnosis not present

## 2018-03-11 DIAGNOSIS — M9901 Segmental and somatic dysfunction of cervical region: Secondary | ICD-10-CM | POA: Diagnosis not present

## 2018-03-11 DIAGNOSIS — M5413 Radiculopathy, cervicothoracic region: Secondary | ICD-10-CM | POA: Diagnosis not present

## 2018-03-11 DIAGNOSIS — M9902 Segmental and somatic dysfunction of thoracic region: Secondary | ICD-10-CM | POA: Diagnosis not present

## 2018-03-11 DIAGNOSIS — M5124 Other intervertebral disc displacement, thoracic region: Secondary | ICD-10-CM | POA: Diagnosis not present

## 2018-04-02 DIAGNOSIS — M546 Pain in thoracic spine: Secondary | ICD-10-CM | POA: Diagnosis not present

## 2018-04-02 DIAGNOSIS — M9901 Segmental and somatic dysfunction of cervical region: Secondary | ICD-10-CM | POA: Diagnosis not present

## 2018-04-02 DIAGNOSIS — M50323 Other cervical disc degeneration at C6-C7 level: Secondary | ICD-10-CM | POA: Diagnosis not present

## 2018-04-02 DIAGNOSIS — M62830 Muscle spasm of back: Secondary | ICD-10-CM | POA: Diagnosis not present

## 2018-04-02 DIAGNOSIS — M9902 Segmental and somatic dysfunction of thoracic region: Secondary | ICD-10-CM | POA: Diagnosis not present

## 2018-04-02 DIAGNOSIS — M5413 Radiculopathy, cervicothoracic region: Secondary | ICD-10-CM | POA: Diagnosis not present

## 2018-04-02 DIAGNOSIS — M5124 Other intervertebral disc displacement, thoracic region: Secondary | ICD-10-CM | POA: Diagnosis not present

## 2018-04-14 DIAGNOSIS — M5124 Other intervertebral disc displacement, thoracic region: Secondary | ICD-10-CM | POA: Diagnosis not present

## 2018-04-14 DIAGNOSIS — M5413 Radiculopathy, cervicothoracic region: Secondary | ICD-10-CM | POA: Diagnosis not present

## 2018-04-14 DIAGNOSIS — M62830 Muscle spasm of back: Secondary | ICD-10-CM | POA: Diagnosis not present

## 2018-04-14 DIAGNOSIS — M9902 Segmental and somatic dysfunction of thoracic region: Secondary | ICD-10-CM | POA: Diagnosis not present

## 2018-04-14 DIAGNOSIS — M546 Pain in thoracic spine: Secondary | ICD-10-CM | POA: Diagnosis not present

## 2018-04-14 DIAGNOSIS — M9901 Segmental and somatic dysfunction of cervical region: Secondary | ICD-10-CM | POA: Diagnosis not present

## 2018-04-14 DIAGNOSIS — M50323 Other cervical disc degeneration at C6-C7 level: Secondary | ICD-10-CM | POA: Diagnosis not present

## 2018-04-16 DIAGNOSIS — M62830 Muscle spasm of back: Secondary | ICD-10-CM | POA: Diagnosis not present

## 2018-04-16 DIAGNOSIS — M546 Pain in thoracic spine: Secondary | ICD-10-CM | POA: Diagnosis not present

## 2018-04-16 DIAGNOSIS — M50323 Other cervical disc degeneration at C6-C7 level: Secondary | ICD-10-CM | POA: Diagnosis not present

## 2018-04-16 DIAGNOSIS — M5124 Other intervertebral disc displacement, thoracic region: Secondary | ICD-10-CM | POA: Diagnosis not present

## 2018-04-16 DIAGNOSIS — M5413 Radiculopathy, cervicothoracic region: Secondary | ICD-10-CM | POA: Diagnosis not present

## 2018-04-16 DIAGNOSIS — M9902 Segmental and somatic dysfunction of thoracic region: Secondary | ICD-10-CM | POA: Diagnosis not present

## 2018-04-16 DIAGNOSIS — M9901 Segmental and somatic dysfunction of cervical region: Secondary | ICD-10-CM | POA: Diagnosis not present

## 2018-04-18 DIAGNOSIS — M5124 Other intervertebral disc displacement, thoracic region: Secondary | ICD-10-CM | POA: Diagnosis not present

## 2018-04-18 DIAGNOSIS — M9902 Segmental and somatic dysfunction of thoracic region: Secondary | ICD-10-CM | POA: Diagnosis not present

## 2018-04-18 DIAGNOSIS — M5413 Radiculopathy, cervicothoracic region: Secondary | ICD-10-CM | POA: Diagnosis not present

## 2018-04-18 DIAGNOSIS — M50323 Other cervical disc degeneration at C6-C7 level: Secondary | ICD-10-CM | POA: Diagnosis not present

## 2018-04-18 DIAGNOSIS — M546 Pain in thoracic spine: Secondary | ICD-10-CM | POA: Diagnosis not present

## 2018-04-18 DIAGNOSIS — M9901 Segmental and somatic dysfunction of cervical region: Secondary | ICD-10-CM | POA: Diagnosis not present

## 2018-04-18 DIAGNOSIS — M62830 Muscle spasm of back: Secondary | ICD-10-CM | POA: Diagnosis not present

## 2018-04-21 DIAGNOSIS — M5124 Other intervertebral disc displacement, thoracic region: Secondary | ICD-10-CM | POA: Diagnosis not present

## 2018-04-21 DIAGNOSIS — M50323 Other cervical disc degeneration at C6-C7 level: Secondary | ICD-10-CM | POA: Diagnosis not present

## 2018-04-21 DIAGNOSIS — M62830 Muscle spasm of back: Secondary | ICD-10-CM | POA: Diagnosis not present

## 2018-04-21 DIAGNOSIS — M9901 Segmental and somatic dysfunction of cervical region: Secondary | ICD-10-CM | POA: Diagnosis not present

## 2018-04-21 DIAGNOSIS — M546 Pain in thoracic spine: Secondary | ICD-10-CM | POA: Diagnosis not present

## 2018-04-21 DIAGNOSIS — M9902 Segmental and somatic dysfunction of thoracic region: Secondary | ICD-10-CM | POA: Diagnosis not present

## 2018-04-21 DIAGNOSIS — M5413 Radiculopathy, cervicothoracic region: Secondary | ICD-10-CM | POA: Diagnosis not present

## 2018-04-23 DIAGNOSIS — J019 Acute sinusitis, unspecified: Secondary | ICD-10-CM | POA: Diagnosis not present

## 2018-04-23 DIAGNOSIS — M5413 Radiculopathy, cervicothoracic region: Secondary | ICD-10-CM | POA: Diagnosis not present

## 2018-04-23 DIAGNOSIS — M9901 Segmental and somatic dysfunction of cervical region: Secondary | ICD-10-CM | POA: Diagnosis not present

## 2018-04-23 DIAGNOSIS — M50323 Other cervical disc degeneration at C6-C7 level: Secondary | ICD-10-CM | POA: Diagnosis not present

## 2018-04-23 DIAGNOSIS — M62830 Muscle spasm of back: Secondary | ICD-10-CM | POA: Diagnosis not present

## 2018-04-23 DIAGNOSIS — M5124 Other intervertebral disc displacement, thoracic region: Secondary | ICD-10-CM | POA: Diagnosis not present

## 2018-04-23 DIAGNOSIS — M9902 Segmental and somatic dysfunction of thoracic region: Secondary | ICD-10-CM | POA: Diagnosis not present

## 2018-04-23 DIAGNOSIS — M546 Pain in thoracic spine: Secondary | ICD-10-CM | POA: Diagnosis not present

## 2018-04-25 DIAGNOSIS — M9902 Segmental and somatic dysfunction of thoracic region: Secondary | ICD-10-CM | POA: Diagnosis not present

## 2018-04-25 DIAGNOSIS — M5413 Radiculopathy, cervicothoracic region: Secondary | ICD-10-CM | POA: Diagnosis not present

## 2018-04-25 DIAGNOSIS — M62830 Muscle spasm of back: Secondary | ICD-10-CM | POA: Diagnosis not present

## 2018-04-25 DIAGNOSIS — M50323 Other cervical disc degeneration at C6-C7 level: Secondary | ICD-10-CM | POA: Diagnosis not present

## 2018-04-25 DIAGNOSIS — M9901 Segmental and somatic dysfunction of cervical region: Secondary | ICD-10-CM | POA: Diagnosis not present

## 2018-04-25 DIAGNOSIS — M546 Pain in thoracic spine: Secondary | ICD-10-CM | POA: Diagnosis not present

## 2018-04-25 DIAGNOSIS — M5124 Other intervertebral disc displacement, thoracic region: Secondary | ICD-10-CM | POA: Diagnosis not present

## 2018-04-28 DIAGNOSIS — M5124 Other intervertebral disc displacement, thoracic region: Secondary | ICD-10-CM | POA: Diagnosis not present

## 2018-04-28 DIAGNOSIS — M5413 Radiculopathy, cervicothoracic region: Secondary | ICD-10-CM | POA: Diagnosis not present

## 2018-04-28 DIAGNOSIS — M9902 Segmental and somatic dysfunction of thoracic region: Secondary | ICD-10-CM | POA: Diagnosis not present

## 2018-04-28 DIAGNOSIS — M9901 Segmental and somatic dysfunction of cervical region: Secondary | ICD-10-CM | POA: Diagnosis not present

## 2018-04-28 DIAGNOSIS — M50323 Other cervical disc degeneration at C6-C7 level: Secondary | ICD-10-CM | POA: Diagnosis not present

## 2018-04-28 DIAGNOSIS — M62830 Muscle spasm of back: Secondary | ICD-10-CM | POA: Diagnosis not present

## 2018-04-28 DIAGNOSIS — M546 Pain in thoracic spine: Secondary | ICD-10-CM | POA: Diagnosis not present

## 2018-04-30 DIAGNOSIS — M5124 Other intervertebral disc displacement, thoracic region: Secondary | ICD-10-CM | POA: Diagnosis not present

## 2018-04-30 DIAGNOSIS — M62830 Muscle spasm of back: Secondary | ICD-10-CM | POA: Diagnosis not present

## 2018-04-30 DIAGNOSIS — M50323 Other cervical disc degeneration at C6-C7 level: Secondary | ICD-10-CM | POA: Diagnosis not present

## 2018-04-30 DIAGNOSIS — M5413 Radiculopathy, cervicothoracic region: Secondary | ICD-10-CM | POA: Diagnosis not present

## 2018-04-30 DIAGNOSIS — M9901 Segmental and somatic dysfunction of cervical region: Secondary | ICD-10-CM | POA: Diagnosis not present

## 2018-04-30 DIAGNOSIS — M546 Pain in thoracic spine: Secondary | ICD-10-CM | POA: Diagnosis not present

## 2018-04-30 DIAGNOSIS — M9902 Segmental and somatic dysfunction of thoracic region: Secondary | ICD-10-CM | POA: Diagnosis not present

## 2018-05-15 DIAGNOSIS — M5413 Radiculopathy, cervicothoracic region: Secondary | ICD-10-CM | POA: Diagnosis not present

## 2018-05-15 DIAGNOSIS — M62830 Muscle spasm of back: Secondary | ICD-10-CM | POA: Diagnosis not present

## 2018-05-15 DIAGNOSIS — M5124 Other intervertebral disc displacement, thoracic region: Secondary | ICD-10-CM | POA: Diagnosis not present

## 2018-05-15 DIAGNOSIS — M9901 Segmental and somatic dysfunction of cervical region: Secondary | ICD-10-CM | POA: Diagnosis not present

## 2018-05-15 DIAGNOSIS — M546 Pain in thoracic spine: Secondary | ICD-10-CM | POA: Diagnosis not present

## 2018-05-15 DIAGNOSIS — M50323 Other cervical disc degeneration at C6-C7 level: Secondary | ICD-10-CM | POA: Diagnosis not present

## 2018-05-15 DIAGNOSIS — M9902 Segmental and somatic dysfunction of thoracic region: Secondary | ICD-10-CM | POA: Diagnosis not present

## 2018-05-19 DIAGNOSIS — M62830 Muscle spasm of back: Secondary | ICD-10-CM | POA: Diagnosis not present

## 2018-05-19 DIAGNOSIS — M546 Pain in thoracic spine: Secondary | ICD-10-CM | POA: Diagnosis not present

## 2018-05-19 DIAGNOSIS — M50323 Other cervical disc degeneration at C6-C7 level: Secondary | ICD-10-CM | POA: Diagnosis not present

## 2018-05-19 DIAGNOSIS — M9902 Segmental and somatic dysfunction of thoracic region: Secondary | ICD-10-CM | POA: Diagnosis not present

## 2018-05-19 DIAGNOSIS — M9901 Segmental and somatic dysfunction of cervical region: Secondary | ICD-10-CM | POA: Diagnosis not present

## 2018-05-19 DIAGNOSIS — M5413 Radiculopathy, cervicothoracic region: Secondary | ICD-10-CM | POA: Diagnosis not present

## 2018-05-19 DIAGNOSIS — M5124 Other intervertebral disc displacement, thoracic region: Secondary | ICD-10-CM | POA: Diagnosis not present

## 2018-05-21 DIAGNOSIS — M62830 Muscle spasm of back: Secondary | ICD-10-CM | POA: Diagnosis not present

## 2018-05-21 DIAGNOSIS — M5413 Radiculopathy, cervicothoracic region: Secondary | ICD-10-CM | POA: Diagnosis not present

## 2018-05-21 DIAGNOSIS — M546 Pain in thoracic spine: Secondary | ICD-10-CM | POA: Diagnosis not present

## 2018-05-21 DIAGNOSIS — M9902 Segmental and somatic dysfunction of thoracic region: Secondary | ICD-10-CM | POA: Diagnosis not present

## 2018-05-21 DIAGNOSIS — M5124 Other intervertebral disc displacement, thoracic region: Secondary | ICD-10-CM | POA: Diagnosis not present

## 2018-05-21 DIAGNOSIS — M9901 Segmental and somatic dysfunction of cervical region: Secondary | ICD-10-CM | POA: Diagnosis not present

## 2018-05-21 DIAGNOSIS — M50323 Other cervical disc degeneration at C6-C7 level: Secondary | ICD-10-CM | POA: Diagnosis not present

## 2018-05-23 DIAGNOSIS — M62830 Muscle spasm of back: Secondary | ICD-10-CM | POA: Diagnosis not present

## 2018-05-23 DIAGNOSIS — M9902 Segmental and somatic dysfunction of thoracic region: Secondary | ICD-10-CM | POA: Diagnosis not present

## 2018-05-23 DIAGNOSIS — M5124 Other intervertebral disc displacement, thoracic region: Secondary | ICD-10-CM | POA: Diagnosis not present

## 2018-05-23 DIAGNOSIS — M9901 Segmental and somatic dysfunction of cervical region: Secondary | ICD-10-CM | POA: Diagnosis not present

## 2018-05-23 DIAGNOSIS — M50323 Other cervical disc degeneration at C6-C7 level: Secondary | ICD-10-CM | POA: Diagnosis not present

## 2018-05-23 DIAGNOSIS — M546 Pain in thoracic spine: Secondary | ICD-10-CM | POA: Diagnosis not present

## 2018-06-03 DIAGNOSIS — M5124 Other intervertebral disc displacement, thoracic region: Secondary | ICD-10-CM | POA: Diagnosis not present

## 2018-06-03 DIAGNOSIS — M5413 Radiculopathy, cervicothoracic region: Secondary | ICD-10-CM | POA: Diagnosis not present

## 2018-06-03 DIAGNOSIS — M546 Pain in thoracic spine: Secondary | ICD-10-CM | POA: Diagnosis not present

## 2018-06-03 DIAGNOSIS — M9902 Segmental and somatic dysfunction of thoracic region: Secondary | ICD-10-CM | POA: Diagnosis not present

## 2018-06-03 DIAGNOSIS — M62830 Muscle spasm of back: Secondary | ICD-10-CM | POA: Diagnosis not present

## 2018-06-03 DIAGNOSIS — M9901 Segmental and somatic dysfunction of cervical region: Secondary | ICD-10-CM | POA: Diagnosis not present

## 2018-06-03 DIAGNOSIS — M50323 Other cervical disc degeneration at C6-C7 level: Secondary | ICD-10-CM | POA: Diagnosis not present

## 2018-06-09 DIAGNOSIS — N400 Enlarged prostate without lower urinary tract symptoms: Secondary | ICD-10-CM | POA: Diagnosis not present

## 2018-06-09 DIAGNOSIS — E119 Type 2 diabetes mellitus without complications: Secondary | ICD-10-CM | POA: Diagnosis not present

## 2018-06-09 DIAGNOSIS — E7800 Pure hypercholesterolemia, unspecified: Secondary | ICD-10-CM | POA: Diagnosis not present

## 2018-06-09 DIAGNOSIS — I10 Essential (primary) hypertension: Secondary | ICD-10-CM | POA: Diagnosis not present

## 2018-06-19 DIAGNOSIS — M50323 Other cervical disc degeneration at C6-C7 level: Secondary | ICD-10-CM | POA: Diagnosis not present

## 2018-06-19 DIAGNOSIS — M5413 Radiculopathy, cervicothoracic region: Secondary | ICD-10-CM | POA: Diagnosis not present

## 2018-06-19 DIAGNOSIS — M9901 Segmental and somatic dysfunction of cervical region: Secondary | ICD-10-CM | POA: Diagnosis not present

## 2018-06-19 DIAGNOSIS — M62830 Muscle spasm of back: Secondary | ICD-10-CM | POA: Diagnosis not present

## 2018-06-19 DIAGNOSIS — M9902 Segmental and somatic dysfunction of thoracic region: Secondary | ICD-10-CM | POA: Diagnosis not present

## 2018-06-19 DIAGNOSIS — M546 Pain in thoracic spine: Secondary | ICD-10-CM | POA: Diagnosis not present

## 2018-06-19 DIAGNOSIS — M5124 Other intervertebral disc displacement, thoracic region: Secondary | ICD-10-CM | POA: Diagnosis not present

## 2018-07-10 DIAGNOSIS — M9901 Segmental and somatic dysfunction of cervical region: Secondary | ICD-10-CM | POA: Diagnosis not present

## 2018-07-10 DIAGNOSIS — M5124 Other intervertebral disc displacement, thoracic region: Secondary | ICD-10-CM | POA: Diagnosis not present

## 2018-07-10 DIAGNOSIS — M5413 Radiculopathy, cervicothoracic region: Secondary | ICD-10-CM | POA: Diagnosis not present

## 2018-07-10 DIAGNOSIS — M9902 Segmental and somatic dysfunction of thoracic region: Secondary | ICD-10-CM | POA: Diagnosis not present

## 2018-07-10 DIAGNOSIS — M50323 Other cervical disc degeneration at C6-C7 level: Secondary | ICD-10-CM | POA: Diagnosis not present

## 2018-07-10 DIAGNOSIS — M546 Pain in thoracic spine: Secondary | ICD-10-CM | POA: Diagnosis not present

## 2018-07-10 DIAGNOSIS — M62830 Muscle spasm of back: Secondary | ICD-10-CM | POA: Diagnosis not present

## 2018-07-30 DIAGNOSIS — M5124 Other intervertebral disc displacement, thoracic region: Secondary | ICD-10-CM | POA: Diagnosis not present

## 2018-07-30 DIAGNOSIS — M9902 Segmental and somatic dysfunction of thoracic region: Secondary | ICD-10-CM | POA: Diagnosis not present

## 2018-07-30 DIAGNOSIS — M546 Pain in thoracic spine: Secondary | ICD-10-CM | POA: Diagnosis not present

## 2018-07-30 DIAGNOSIS — M62830 Muscle spasm of back: Secondary | ICD-10-CM | POA: Diagnosis not present

## 2018-07-30 DIAGNOSIS — M9901 Segmental and somatic dysfunction of cervical region: Secondary | ICD-10-CM | POA: Diagnosis not present

## 2018-07-30 DIAGNOSIS — M5413 Radiculopathy, cervicothoracic region: Secondary | ICD-10-CM | POA: Diagnosis not present

## 2018-07-30 DIAGNOSIS — M50323 Other cervical disc degeneration at C6-C7 level: Secondary | ICD-10-CM | POA: Diagnosis not present

## 2018-08-04 DIAGNOSIS — Z9181 History of falling: Secondary | ICD-10-CM | POA: Diagnosis not present

## 2018-08-04 DIAGNOSIS — Z0000 Encounter for general adult medical examination without abnormal findings: Secondary | ICD-10-CM | POA: Diagnosis not present

## 2018-08-04 DIAGNOSIS — E785 Hyperlipidemia, unspecified: Secondary | ICD-10-CM | POA: Diagnosis not present

## 2018-08-04 DIAGNOSIS — Z1331 Encounter for screening for depression: Secondary | ICD-10-CM | POA: Diagnosis not present

## 2018-08-04 DIAGNOSIS — Z125 Encounter for screening for malignant neoplasm of prostate: Secondary | ICD-10-CM | POA: Diagnosis not present

## 2018-08-04 DIAGNOSIS — Z6838 Body mass index (BMI) 38.0-38.9, adult: Secondary | ICD-10-CM | POA: Diagnosis not present

## 2018-08-08 DIAGNOSIS — G4730 Sleep apnea, unspecified: Secondary | ICD-10-CM | POA: Diagnosis not present

## 2018-08-11 DIAGNOSIS — E7800 Pure hypercholesterolemia, unspecified: Secondary | ICD-10-CM | POA: Diagnosis not present

## 2018-08-11 DIAGNOSIS — G8929 Other chronic pain: Secondary | ICD-10-CM | POA: Diagnosis not present

## 2018-08-11 DIAGNOSIS — M545 Low back pain: Secondary | ICD-10-CM | POA: Diagnosis not present

## 2018-08-11 DIAGNOSIS — E119 Type 2 diabetes mellitus without complications: Secondary | ICD-10-CM | POA: Diagnosis not present

## 2018-08-11 DIAGNOSIS — Z139 Encounter for screening, unspecified: Secondary | ICD-10-CM | POA: Diagnosis not present

## 2018-08-11 DIAGNOSIS — Z79899 Other long term (current) drug therapy: Secondary | ICD-10-CM | POA: Diagnosis not present

## 2018-08-11 DIAGNOSIS — I10 Essential (primary) hypertension: Secondary | ICD-10-CM | POA: Diagnosis not present

## 2018-08-11 DIAGNOSIS — Z125 Encounter for screening for malignant neoplasm of prostate: Secondary | ICD-10-CM | POA: Diagnosis not present

## 2018-08-27 DIAGNOSIS — M62830 Muscle spasm of back: Secondary | ICD-10-CM | POA: Diagnosis not present

## 2018-08-27 DIAGNOSIS — M5413 Radiculopathy, cervicothoracic region: Secondary | ICD-10-CM | POA: Diagnosis not present

## 2018-08-27 DIAGNOSIS — M50323 Other cervical disc degeneration at C6-C7 level: Secondary | ICD-10-CM | POA: Diagnosis not present

## 2018-08-27 DIAGNOSIS — M546 Pain in thoracic spine: Secondary | ICD-10-CM | POA: Diagnosis not present

## 2018-08-27 DIAGNOSIS — M9901 Segmental and somatic dysfunction of cervical region: Secondary | ICD-10-CM | POA: Diagnosis not present

## 2018-08-27 DIAGNOSIS — M9902 Segmental and somatic dysfunction of thoracic region: Secondary | ICD-10-CM | POA: Diagnosis not present

## 2018-08-27 DIAGNOSIS — M5124 Other intervertebral disc displacement, thoracic region: Secondary | ICD-10-CM | POA: Diagnosis not present

## 2018-09-01 DIAGNOSIS — Z125 Encounter for screening for malignant neoplasm of prostate: Secondary | ICD-10-CM | POA: Diagnosis not present

## 2018-09-05 DIAGNOSIS — R310 Gross hematuria: Secondary | ICD-10-CM | POA: Diagnosis not present

## 2018-09-05 DIAGNOSIS — N281 Cyst of kidney, acquired: Secondary | ICD-10-CM | POA: Diagnosis not present

## 2018-09-17 DIAGNOSIS — M62830 Muscle spasm of back: Secondary | ICD-10-CM | POA: Diagnosis not present

## 2018-09-17 DIAGNOSIS — M9901 Segmental and somatic dysfunction of cervical region: Secondary | ICD-10-CM | POA: Diagnosis not present

## 2018-09-17 DIAGNOSIS — M546 Pain in thoracic spine: Secondary | ICD-10-CM | POA: Diagnosis not present

## 2018-09-17 DIAGNOSIS — M5124 Other intervertebral disc displacement, thoracic region: Secondary | ICD-10-CM | POA: Diagnosis not present

## 2018-09-17 DIAGNOSIS — M50323 Other cervical disc degeneration at C6-C7 level: Secondary | ICD-10-CM | POA: Diagnosis not present

## 2018-09-17 DIAGNOSIS — M9902 Segmental and somatic dysfunction of thoracic region: Secondary | ICD-10-CM | POA: Diagnosis not present

## 2018-09-17 DIAGNOSIS — M5413 Radiculopathy, cervicothoracic region: Secondary | ICD-10-CM | POA: Diagnosis not present

## 2018-10-08 DIAGNOSIS — M9901 Segmental and somatic dysfunction of cervical region: Secondary | ICD-10-CM | POA: Diagnosis not present

## 2018-10-08 DIAGNOSIS — M62830 Muscle spasm of back: Secondary | ICD-10-CM | POA: Diagnosis not present

## 2018-10-08 DIAGNOSIS — M5124 Other intervertebral disc displacement, thoracic region: Secondary | ICD-10-CM | POA: Diagnosis not present

## 2018-10-08 DIAGNOSIS — M5413 Radiculopathy, cervicothoracic region: Secondary | ICD-10-CM | POA: Diagnosis not present

## 2018-10-08 DIAGNOSIS — M546 Pain in thoracic spine: Secondary | ICD-10-CM | POA: Diagnosis not present

## 2018-10-08 DIAGNOSIS — M50323 Other cervical disc degeneration at C6-C7 level: Secondary | ICD-10-CM | POA: Diagnosis not present

## 2018-10-08 DIAGNOSIS — M9902 Segmental and somatic dysfunction of thoracic region: Secondary | ICD-10-CM | POA: Diagnosis not present

## 2018-11-05 DIAGNOSIS — M9901 Segmental and somatic dysfunction of cervical region: Secondary | ICD-10-CM | POA: Diagnosis not present

## 2018-11-05 DIAGNOSIS — M50323 Other cervical disc degeneration at C6-C7 level: Secondary | ICD-10-CM | POA: Diagnosis not present

## 2018-11-05 DIAGNOSIS — M9902 Segmental and somatic dysfunction of thoracic region: Secondary | ICD-10-CM | POA: Diagnosis not present

## 2018-11-05 DIAGNOSIS — M5413 Radiculopathy, cervicothoracic region: Secondary | ICD-10-CM | POA: Diagnosis not present

## 2018-11-05 DIAGNOSIS — M546 Pain in thoracic spine: Secondary | ICD-10-CM | POA: Diagnosis not present

## 2018-11-05 DIAGNOSIS — M62830 Muscle spasm of back: Secondary | ICD-10-CM | POA: Diagnosis not present

## 2018-11-05 DIAGNOSIS — M5124 Other intervertebral disc displacement, thoracic region: Secondary | ICD-10-CM | POA: Diagnosis not present

## 2018-11-11 DIAGNOSIS — E119 Type 2 diabetes mellitus without complications: Secondary | ICD-10-CM | POA: Diagnosis not present

## 2018-11-11 DIAGNOSIS — Z23 Encounter for immunization: Secondary | ICD-10-CM | POA: Diagnosis not present

## 2018-11-11 DIAGNOSIS — N400 Enlarged prostate without lower urinary tract symptoms: Secondary | ICD-10-CM | POA: Diagnosis not present

## 2018-11-11 DIAGNOSIS — E7800 Pure hypercholesterolemia, unspecified: Secondary | ICD-10-CM | POA: Diagnosis not present

## 2018-11-11 DIAGNOSIS — I10 Essential (primary) hypertension: Secondary | ICD-10-CM | POA: Diagnosis not present

## 2018-11-16 DIAGNOSIS — L509 Urticaria, unspecified: Secondary | ICD-10-CM | POA: Diagnosis not present

## 2018-11-24 DIAGNOSIS — M5413 Radiculopathy, cervicothoracic region: Secondary | ICD-10-CM | POA: Diagnosis not present

## 2018-11-24 DIAGNOSIS — M5124 Other intervertebral disc displacement, thoracic region: Secondary | ICD-10-CM | POA: Diagnosis not present

## 2018-11-24 DIAGNOSIS — M50323 Other cervical disc degeneration at C6-C7 level: Secondary | ICD-10-CM | POA: Diagnosis not present

## 2018-11-24 DIAGNOSIS — M546 Pain in thoracic spine: Secondary | ICD-10-CM | POA: Diagnosis not present

## 2018-11-24 DIAGNOSIS — M62830 Muscle spasm of back: Secondary | ICD-10-CM | POA: Diagnosis not present

## 2018-11-24 DIAGNOSIS — M9901 Segmental and somatic dysfunction of cervical region: Secondary | ICD-10-CM | POA: Diagnosis not present

## 2018-11-24 DIAGNOSIS — M9902 Segmental and somatic dysfunction of thoracic region: Secondary | ICD-10-CM | POA: Diagnosis not present

## 2018-12-15 DIAGNOSIS — M25512 Pain in left shoulder: Secondary | ICD-10-CM | POA: Diagnosis not present

## 2018-12-15 DIAGNOSIS — M75102 Unspecified rotator cuff tear or rupture of left shoulder, not specified as traumatic: Secondary | ICD-10-CM | POA: Diagnosis not present

## 2018-12-15 DIAGNOSIS — M7542 Impingement syndrome of left shoulder: Secondary | ICD-10-CM | POA: Diagnosis not present

## 2018-12-15 DIAGNOSIS — M7552 Bursitis of left shoulder: Secondary | ICD-10-CM | POA: Diagnosis not present

## 2018-12-15 DIAGNOSIS — M1712 Unilateral primary osteoarthritis, left knee: Secondary | ICD-10-CM | POA: Diagnosis not present

## 2018-12-16 DIAGNOSIS — M5124 Other intervertebral disc displacement, thoracic region: Secondary | ICD-10-CM | POA: Diagnosis not present

## 2018-12-16 DIAGNOSIS — M5413 Radiculopathy, cervicothoracic region: Secondary | ICD-10-CM | POA: Diagnosis not present

## 2018-12-16 DIAGNOSIS — M546 Pain in thoracic spine: Secondary | ICD-10-CM | POA: Diagnosis not present

## 2018-12-16 DIAGNOSIS — M9902 Segmental and somatic dysfunction of thoracic region: Secondary | ICD-10-CM | POA: Diagnosis not present

## 2018-12-16 DIAGNOSIS — M9901 Segmental and somatic dysfunction of cervical region: Secondary | ICD-10-CM | POA: Diagnosis not present

## 2018-12-16 DIAGNOSIS — M62830 Muscle spasm of back: Secondary | ICD-10-CM | POA: Diagnosis not present

## 2018-12-16 DIAGNOSIS — M50323 Other cervical disc degeneration at C6-C7 level: Secondary | ICD-10-CM | POA: Diagnosis not present

## 2019-01-13 DIAGNOSIS — M5124 Other intervertebral disc displacement, thoracic region: Secondary | ICD-10-CM | POA: Diagnosis not present

## 2019-01-13 DIAGNOSIS — M546 Pain in thoracic spine: Secondary | ICD-10-CM | POA: Diagnosis not present

## 2019-01-13 DIAGNOSIS — M9901 Segmental and somatic dysfunction of cervical region: Secondary | ICD-10-CM | POA: Diagnosis not present

## 2019-01-13 DIAGNOSIS — M50323 Other cervical disc degeneration at C6-C7 level: Secondary | ICD-10-CM | POA: Diagnosis not present

## 2019-01-13 DIAGNOSIS — M9902 Segmental and somatic dysfunction of thoracic region: Secondary | ICD-10-CM | POA: Diagnosis not present

## 2019-01-13 DIAGNOSIS — M62830 Muscle spasm of back: Secondary | ICD-10-CM | POA: Diagnosis not present

## 2019-01-13 DIAGNOSIS — M5413 Radiculopathy, cervicothoracic region: Secondary | ICD-10-CM | POA: Diagnosis not present

## 2019-01-20 ENCOUNTER — Other Ambulatory Visit: Payer: Self-pay | Admitting: Urology

## 2019-02-05 DIAGNOSIS — M9902 Segmental and somatic dysfunction of thoracic region: Secondary | ICD-10-CM | POA: Diagnosis not present

## 2019-02-05 DIAGNOSIS — M5124 Other intervertebral disc displacement, thoracic region: Secondary | ICD-10-CM | POA: Diagnosis not present

## 2019-02-05 DIAGNOSIS — M9901 Segmental and somatic dysfunction of cervical region: Secondary | ICD-10-CM | POA: Diagnosis not present

## 2019-02-05 DIAGNOSIS — M5413 Radiculopathy, cervicothoracic region: Secondary | ICD-10-CM | POA: Diagnosis not present

## 2019-02-05 DIAGNOSIS — M546 Pain in thoracic spine: Secondary | ICD-10-CM | POA: Diagnosis not present

## 2019-02-05 DIAGNOSIS — M50323 Other cervical disc degeneration at C6-C7 level: Secondary | ICD-10-CM | POA: Diagnosis not present

## 2019-02-05 DIAGNOSIS — M62830 Muscle spasm of back: Secondary | ICD-10-CM | POA: Diagnosis not present

## 2019-02-11 DIAGNOSIS — N390 Urinary tract infection, site not specified: Secondary | ICD-10-CM | POA: Diagnosis not present

## 2019-02-11 DIAGNOSIS — Z79899 Other long term (current) drug therapy: Secondary | ICD-10-CM | POA: Diagnosis not present

## 2019-02-11 DIAGNOSIS — E7800 Pure hypercholesterolemia, unspecified: Secondary | ICD-10-CM | POA: Diagnosis not present

## 2019-02-18 DIAGNOSIS — Z20822 Contact with and (suspected) exposure to covid-19: Secondary | ICD-10-CM | POA: Diagnosis not present

## 2019-02-18 IMAGING — US US ABDOMEN COMPLETE
1 series · 14 of 25 positions shown · non-contrast
Comparison: MRI 12/28/2014.

CLINICAL DATA: Elevated LFTs

EXAM:
ABDOMEN ULTRASOUND COMPLETE

[Series 1: us abdomen complete · 0.19mm/px · 14 of 83 slices shown]
[im 1/83]
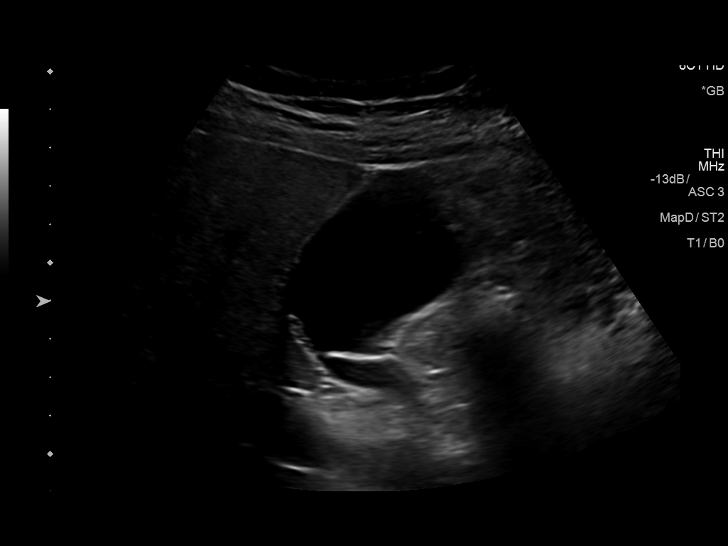
[im 7/83]
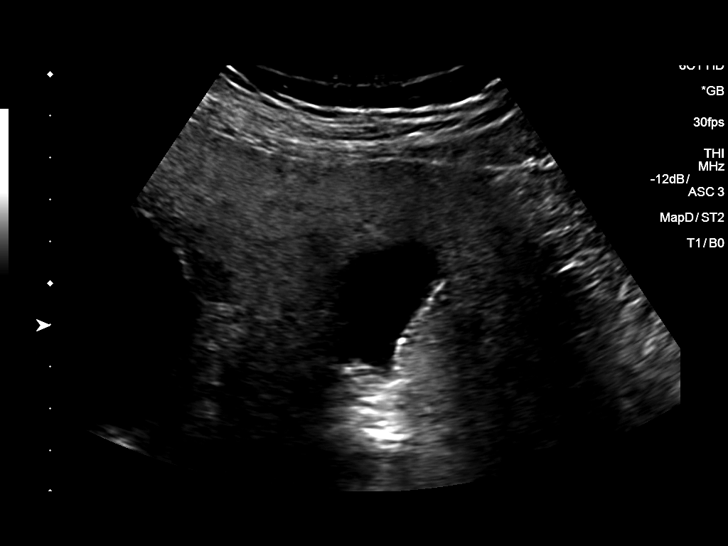
[im 14/83]
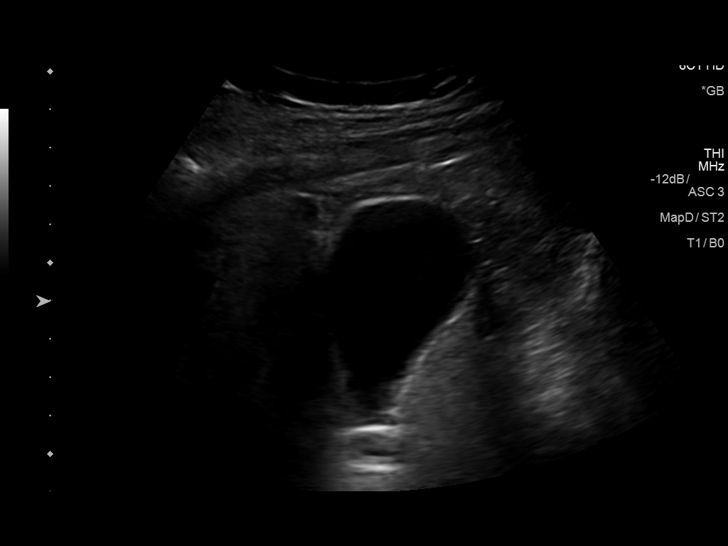
[im 21/83]
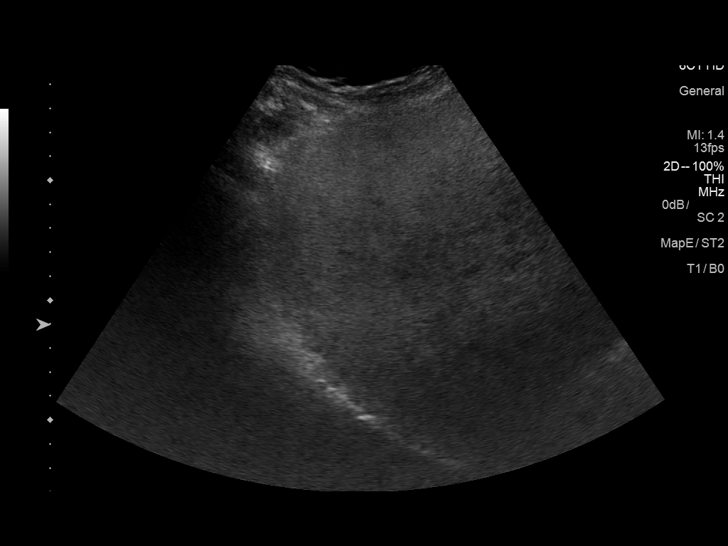
[im 28/83]
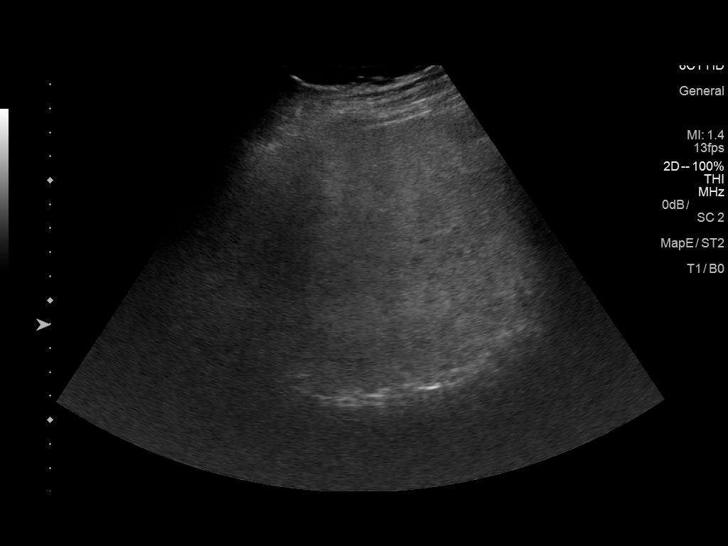
[im 31/83]
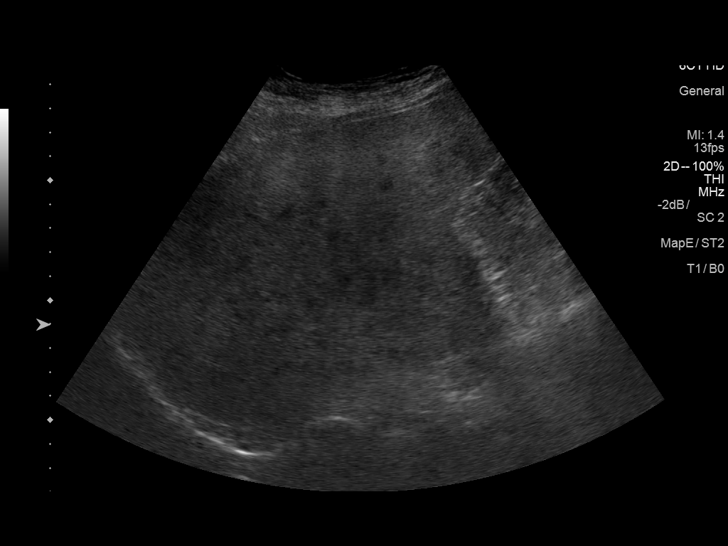
[im 38/83]
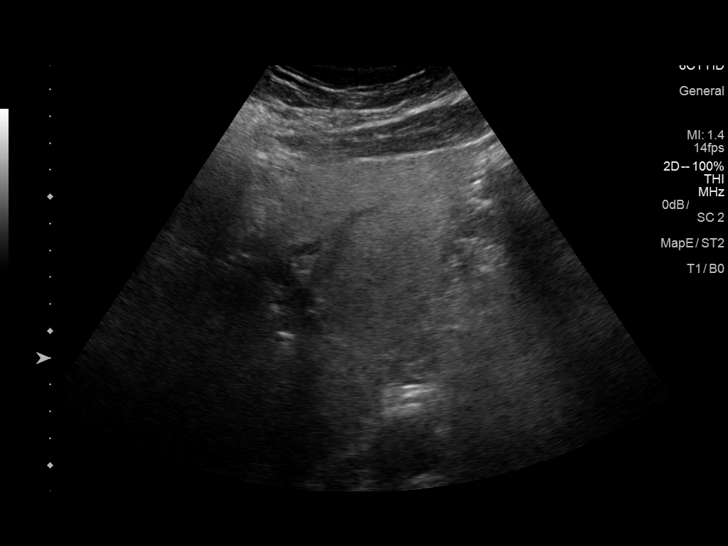
[im 45/83]
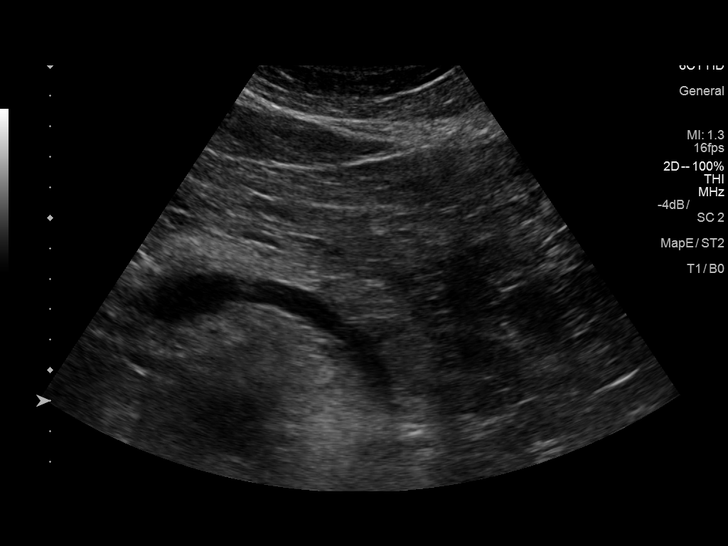
[im 52/83]
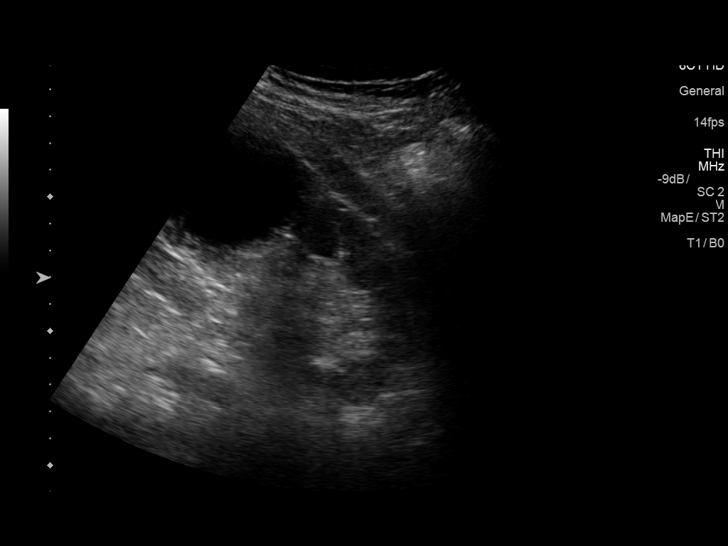
[im 55/83]
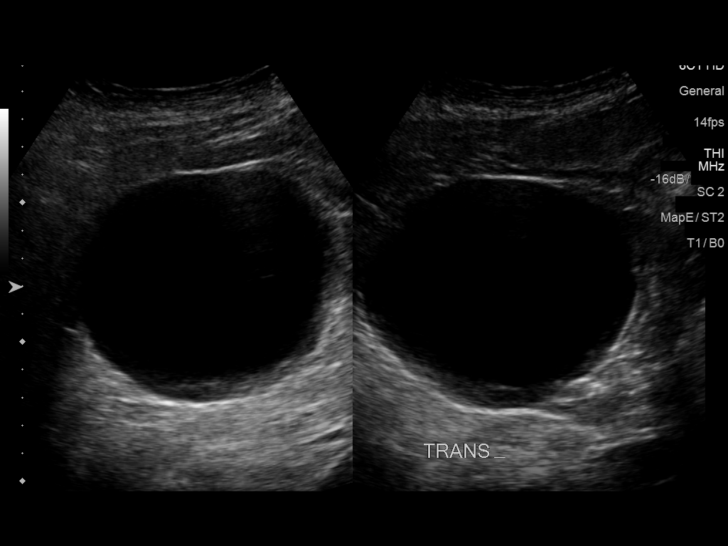
[im 62/83]
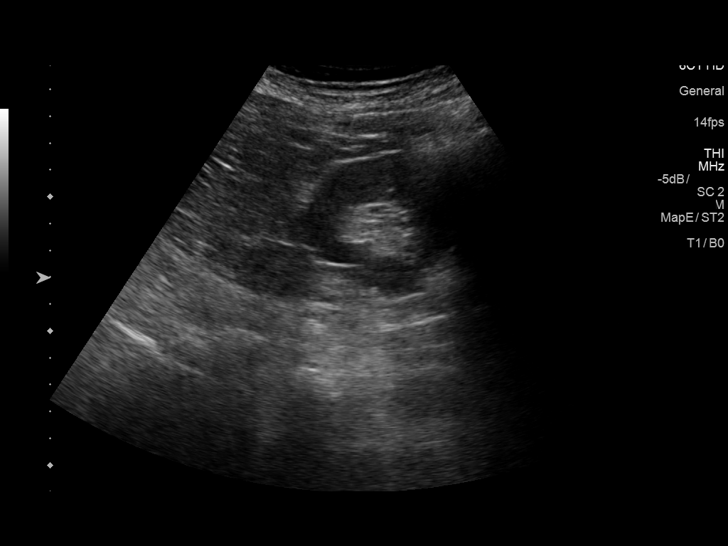
[im 69/83]
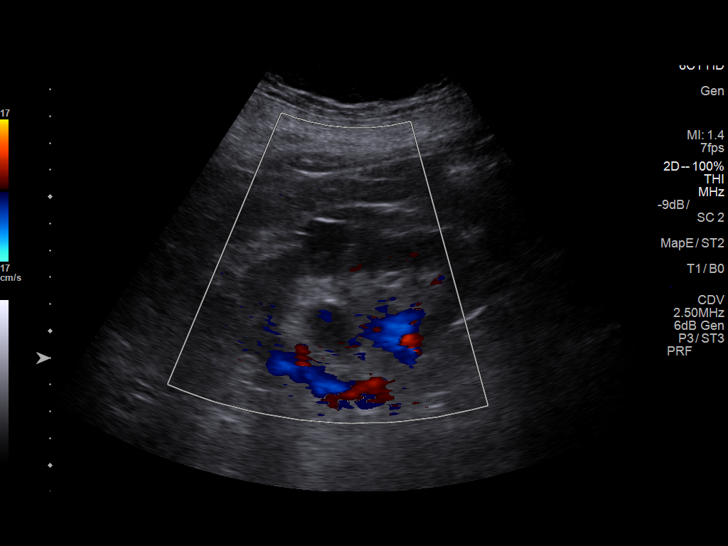
[im 76/83]
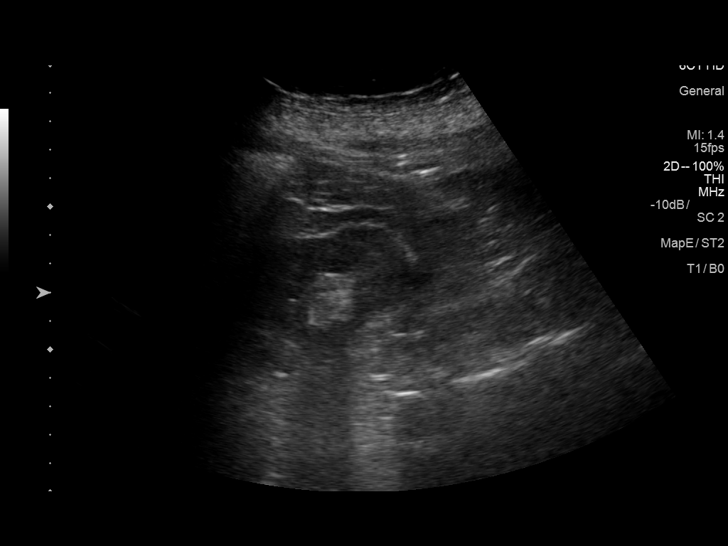
[im 83/83]
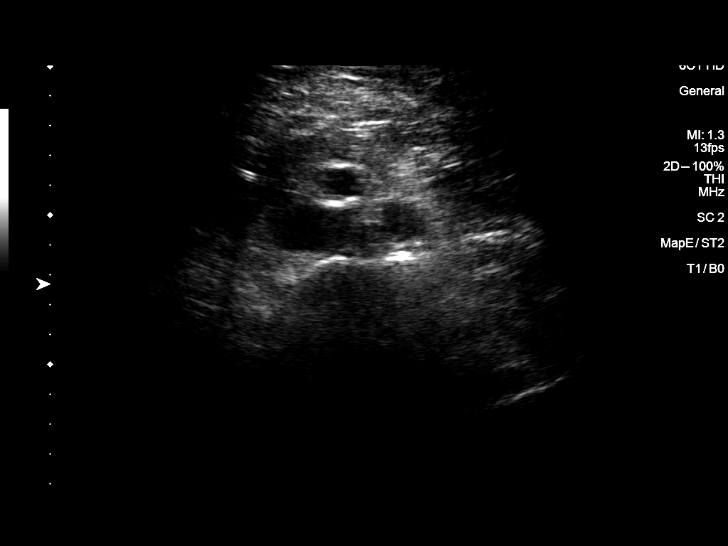

[14 of 25 positions shown; findings below may reference images not displayed]

FINDINGS: Gallbladder: No gallstones or wall thickening visualized. No
sonographic Murphy sign noted by sonographer.

Common bile duct: Diameter: Normal caliber, 2 mm

Liver: Increased echotexture compatible with fatty infiltration. No
focal abnormality or biliary ductal dilatation.

IVC: No abnormality visualized.

Pancreas: Visualized portion unremarkable.

Spleen: Size and appearance within normal limits.

Right Kidney: Length: 15.6 cm 8.9 cm lower pole simple appearing
left renal cyst. Normal echotexture. No hydronephrosis

Left Kidney: Length: 12.3 cm. 1.9 cm midpole parapelvic cyst. Normal
echotexture. No hydronephrosis.

Abdominal aorta: No aneurysm visualized.

Other findings: None.
IMPRESSION: Fatty infiltration of the liver.

Bilateral renal cysts, unchanged since prior MRI.

No acute findings.

## 2019-03-23 DIAGNOSIS — M62830 Muscle spasm of back: Secondary | ICD-10-CM | POA: Diagnosis not present

## 2019-03-23 DIAGNOSIS — M5413 Radiculopathy, cervicothoracic region: Secondary | ICD-10-CM | POA: Diagnosis not present

## 2019-03-23 DIAGNOSIS — M50323 Other cervical disc degeneration at C6-C7 level: Secondary | ICD-10-CM | POA: Diagnosis not present

## 2019-03-23 DIAGNOSIS — M546 Pain in thoracic spine: Secondary | ICD-10-CM | POA: Diagnosis not present

## 2019-03-23 DIAGNOSIS — M5124 Other intervertebral disc displacement, thoracic region: Secondary | ICD-10-CM | POA: Diagnosis not present

## 2019-03-23 DIAGNOSIS — M9902 Segmental and somatic dysfunction of thoracic region: Secondary | ICD-10-CM | POA: Diagnosis not present

## 2019-03-23 DIAGNOSIS — M9901 Segmental and somatic dysfunction of cervical region: Secondary | ICD-10-CM | POA: Diagnosis not present

## 2019-04-14 DIAGNOSIS — M546 Pain in thoracic spine: Secondary | ICD-10-CM | POA: Diagnosis not present

## 2019-04-14 DIAGNOSIS — M50323 Other cervical disc degeneration at C6-C7 level: Secondary | ICD-10-CM | POA: Diagnosis not present

## 2019-04-14 DIAGNOSIS — M9902 Segmental and somatic dysfunction of thoracic region: Secondary | ICD-10-CM | POA: Diagnosis not present

## 2019-04-14 DIAGNOSIS — M62830 Muscle spasm of back: Secondary | ICD-10-CM | POA: Diagnosis not present

## 2019-04-14 DIAGNOSIS — M9901 Segmental and somatic dysfunction of cervical region: Secondary | ICD-10-CM | POA: Diagnosis not present

## 2019-04-14 DIAGNOSIS — M5124 Other intervertebral disc displacement, thoracic region: Secondary | ICD-10-CM | POA: Diagnosis not present

## 2019-04-14 DIAGNOSIS — M5413 Radiculopathy, cervicothoracic region: Secondary | ICD-10-CM | POA: Diagnosis not present

## 2019-04-23 DIAGNOSIS — E119 Type 2 diabetes mellitus without complications: Secondary | ICD-10-CM | POA: Diagnosis not present

## 2019-04-23 DIAGNOSIS — H2513 Age-related nuclear cataract, bilateral: Secondary | ICD-10-CM | POA: Diagnosis not present

## 2019-05-13 DIAGNOSIS — N400 Enlarged prostate without lower urinary tract symptoms: Secondary | ICD-10-CM | POA: Diagnosis not present

## 2019-05-13 DIAGNOSIS — E7800 Pure hypercholesterolemia, unspecified: Secondary | ICD-10-CM | POA: Diagnosis not present

## 2019-05-13 DIAGNOSIS — I10 Essential (primary) hypertension: Secondary | ICD-10-CM | POA: Diagnosis not present

## 2019-05-13 DIAGNOSIS — E119 Type 2 diabetes mellitus without complications: Secondary | ICD-10-CM | POA: Diagnosis not present

## 2019-05-15 DIAGNOSIS — M5413 Radiculopathy, cervicothoracic region: Secondary | ICD-10-CM | POA: Diagnosis not present

## 2019-05-15 DIAGNOSIS — M62830 Muscle spasm of back: Secondary | ICD-10-CM | POA: Diagnosis not present

## 2019-05-15 DIAGNOSIS — M546 Pain in thoracic spine: Secondary | ICD-10-CM | POA: Diagnosis not present

## 2019-05-15 DIAGNOSIS — M9902 Segmental and somatic dysfunction of thoracic region: Secondary | ICD-10-CM | POA: Diagnosis not present

## 2019-05-15 DIAGNOSIS — M9901 Segmental and somatic dysfunction of cervical region: Secondary | ICD-10-CM | POA: Diagnosis not present

## 2019-05-15 DIAGNOSIS — M5124 Other intervertebral disc displacement, thoracic region: Secondary | ICD-10-CM | POA: Diagnosis not present

## 2019-05-15 DIAGNOSIS — M50323 Other cervical disc degeneration at C6-C7 level: Secondary | ICD-10-CM | POA: Diagnosis not present

## 2019-06-12 DIAGNOSIS — M62830 Muscle spasm of back: Secondary | ICD-10-CM | POA: Diagnosis not present

## 2019-06-12 DIAGNOSIS — M9902 Segmental and somatic dysfunction of thoracic region: Secondary | ICD-10-CM | POA: Diagnosis not present

## 2019-06-12 DIAGNOSIS — M9901 Segmental and somatic dysfunction of cervical region: Secondary | ICD-10-CM | POA: Diagnosis not present

## 2019-06-12 DIAGNOSIS — M546 Pain in thoracic spine: Secondary | ICD-10-CM | POA: Diagnosis not present

## 2019-06-12 DIAGNOSIS — M5124 Other intervertebral disc displacement, thoracic region: Secondary | ICD-10-CM | POA: Diagnosis not present

## 2019-06-12 DIAGNOSIS — M5413 Radiculopathy, cervicothoracic region: Secondary | ICD-10-CM | POA: Diagnosis not present

## 2019-06-12 DIAGNOSIS — M50323 Other cervical disc degeneration at C6-C7 level: Secondary | ICD-10-CM | POA: Diagnosis not present

## 2019-07-17 DIAGNOSIS — Z23 Encounter for immunization: Secondary | ICD-10-CM | POA: Diagnosis not present

## 2019-08-10 DIAGNOSIS — Z6839 Body mass index (BMI) 39.0-39.9, adult: Secondary | ICD-10-CM | POA: Diagnosis not present

## 2019-08-10 DIAGNOSIS — E785 Hyperlipidemia, unspecified: Secondary | ICD-10-CM | POA: Diagnosis not present

## 2019-08-10 DIAGNOSIS — Z9181 History of falling: Secondary | ICD-10-CM | POA: Diagnosis not present

## 2019-08-10 DIAGNOSIS — Z1331 Encounter for screening for depression: Secondary | ICD-10-CM | POA: Diagnosis not present

## 2019-08-10 DIAGNOSIS — Z0000 Encounter for general adult medical examination without abnormal findings: Secondary | ICD-10-CM | POA: Diagnosis not present

## 2019-08-12 DIAGNOSIS — Z79899 Other long term (current) drug therapy: Secondary | ICD-10-CM | POA: Diagnosis not present

## 2019-08-12 DIAGNOSIS — E7800 Pure hypercholesterolemia, unspecified: Secondary | ICD-10-CM | POA: Diagnosis not present

## 2019-08-27 DIAGNOSIS — J019 Acute sinusitis, unspecified: Secondary | ICD-10-CM | POA: Diagnosis not present

## 2019-08-27 DIAGNOSIS — Z20822 Contact with and (suspected) exposure to covid-19: Secondary | ICD-10-CM | POA: Diagnosis not present

## 2019-09-21 DIAGNOSIS — M546 Pain in thoracic spine: Secondary | ICD-10-CM | POA: Diagnosis not present

## 2019-09-21 DIAGNOSIS — M62830 Muscle spasm of back: Secondary | ICD-10-CM | POA: Diagnosis not present

## 2019-09-21 DIAGNOSIS — M9902 Segmental and somatic dysfunction of thoracic region: Secondary | ICD-10-CM | POA: Diagnosis not present

## 2019-09-21 DIAGNOSIS — M9901 Segmental and somatic dysfunction of cervical region: Secondary | ICD-10-CM | POA: Diagnosis not present

## 2019-09-21 DIAGNOSIS — M5124 Other intervertebral disc displacement, thoracic region: Secondary | ICD-10-CM | POA: Diagnosis not present

## 2019-09-21 DIAGNOSIS — M5413 Radiculopathy, cervicothoracic region: Secondary | ICD-10-CM | POA: Diagnosis not present

## 2019-09-21 DIAGNOSIS — M50323 Other cervical disc degeneration at C6-C7 level: Secondary | ICD-10-CM | POA: Diagnosis not present

## 2019-10-09 DIAGNOSIS — M170 Bilateral primary osteoarthritis of knee: Secondary | ICD-10-CM | POA: Diagnosis not present

## 2019-10-09 DIAGNOSIS — M25561 Pain in right knee: Secondary | ICD-10-CM | POA: Diagnosis not present

## 2019-10-09 DIAGNOSIS — M25562 Pain in left knee: Secondary | ICD-10-CM | POA: Diagnosis not present

## 2019-10-12 DIAGNOSIS — M5413 Radiculopathy, cervicothoracic region: Secondary | ICD-10-CM | POA: Diagnosis not present

## 2019-10-12 DIAGNOSIS — M5124 Other intervertebral disc displacement, thoracic region: Secondary | ICD-10-CM | POA: Diagnosis not present

## 2019-10-12 DIAGNOSIS — M50323 Other cervical disc degeneration at C6-C7 level: Secondary | ICD-10-CM | POA: Diagnosis not present

## 2019-10-12 DIAGNOSIS — M546 Pain in thoracic spine: Secondary | ICD-10-CM | POA: Diagnosis not present

## 2019-10-12 DIAGNOSIS — M62830 Muscle spasm of back: Secondary | ICD-10-CM | POA: Diagnosis not present

## 2019-10-12 DIAGNOSIS — M9902 Segmental and somatic dysfunction of thoracic region: Secondary | ICD-10-CM | POA: Diagnosis not present

## 2019-10-12 DIAGNOSIS — M9901 Segmental and somatic dysfunction of cervical region: Secondary | ICD-10-CM | POA: Diagnosis not present

## 2019-10-14 DIAGNOSIS — Z6841 Body Mass Index (BMI) 40.0 and over, adult: Secondary | ICD-10-CM | POA: Diagnosis not present

## 2019-10-14 DIAGNOSIS — R1032 Left lower quadrant pain: Secondary | ICD-10-CM | POA: Diagnosis not present

## 2019-10-14 DIAGNOSIS — K625 Hemorrhage of anus and rectum: Secondary | ICD-10-CM | POA: Diagnosis not present

## 2019-10-21 DIAGNOSIS — Z139 Encounter for screening, unspecified: Secondary | ICD-10-CM | POA: Diagnosis not present

## 2019-10-21 DIAGNOSIS — K5792 Diverticulitis of intestine, part unspecified, without perforation or abscess without bleeding: Secondary | ICD-10-CM | POA: Diagnosis not present

## 2019-10-21 DIAGNOSIS — E119 Type 2 diabetes mellitus without complications: Secondary | ICD-10-CM | POA: Diagnosis not present

## 2019-10-21 DIAGNOSIS — Z23 Encounter for immunization: Secondary | ICD-10-CM | POA: Diagnosis not present

## 2019-10-21 DIAGNOSIS — Z6841 Body Mass Index (BMI) 40.0 and over, adult: Secondary | ICD-10-CM | POA: Diagnosis not present

## 2019-11-08 ENCOUNTER — Other Ambulatory Visit: Payer: Self-pay | Admitting: Urology

## 2019-11-12 DIAGNOSIS — N400 Enlarged prostate without lower urinary tract symptoms: Secondary | ICD-10-CM | POA: Diagnosis not present

## 2019-11-12 DIAGNOSIS — I10 Essential (primary) hypertension: Secondary | ICD-10-CM | POA: Diagnosis not present

## 2019-11-12 DIAGNOSIS — E118 Type 2 diabetes mellitus with unspecified complications: Secondary | ICD-10-CM | POA: Diagnosis not present

## 2019-11-12 DIAGNOSIS — M5416 Radiculopathy, lumbar region: Secondary | ICD-10-CM | POA: Diagnosis not present

## 2019-11-12 DIAGNOSIS — Z6839 Body mass index (BMI) 39.0-39.9, adult: Secondary | ICD-10-CM | POA: Diagnosis not present

## 2019-11-12 DIAGNOSIS — M170 Bilateral primary osteoarthritis of knee: Secondary | ICD-10-CM | POA: Diagnosis not present

## 2019-11-12 DIAGNOSIS — E7800 Pure hypercholesterolemia, unspecified: Secondary | ICD-10-CM | POA: Diagnosis not present

## 2019-11-16 DIAGNOSIS — M546 Pain in thoracic spine: Secondary | ICD-10-CM | POA: Diagnosis not present

## 2019-11-16 DIAGNOSIS — M5124 Other intervertebral disc displacement, thoracic region: Secondary | ICD-10-CM | POA: Diagnosis not present

## 2019-11-16 DIAGNOSIS — M50323 Other cervical disc degeneration at C6-C7 level: Secondary | ICD-10-CM | POA: Diagnosis not present

## 2019-11-16 DIAGNOSIS — M9901 Segmental and somatic dysfunction of cervical region: Secondary | ICD-10-CM | POA: Diagnosis not present

## 2019-11-16 DIAGNOSIS — M9902 Segmental and somatic dysfunction of thoracic region: Secondary | ICD-10-CM | POA: Diagnosis not present

## 2019-11-16 DIAGNOSIS — M62830 Muscle spasm of back: Secondary | ICD-10-CM | POA: Diagnosis not present

## 2019-11-16 DIAGNOSIS — M5413 Radiculopathy, cervicothoracic region: Secondary | ICD-10-CM | POA: Diagnosis not present

## 2019-11-27 DIAGNOSIS — M170 Bilateral primary osteoarthritis of knee: Secondary | ICD-10-CM | POA: Diagnosis not present

## 2019-12-03 ENCOUNTER — Other Ambulatory Visit: Payer: Self-pay | Admitting: Urology

## 2019-12-04 DIAGNOSIS — M170 Bilateral primary osteoarthritis of knee: Secondary | ICD-10-CM | POA: Diagnosis not present

## 2019-12-16 DIAGNOSIS — M5124 Other intervertebral disc displacement, thoracic region: Secondary | ICD-10-CM | POA: Diagnosis not present

## 2019-12-16 DIAGNOSIS — M9901 Segmental and somatic dysfunction of cervical region: Secondary | ICD-10-CM | POA: Diagnosis not present

## 2019-12-16 DIAGNOSIS — M546 Pain in thoracic spine: Secondary | ICD-10-CM | POA: Diagnosis not present

## 2019-12-16 DIAGNOSIS — M5413 Radiculopathy, cervicothoracic region: Secondary | ICD-10-CM | POA: Diagnosis not present

## 2019-12-16 DIAGNOSIS — M62830 Muscle spasm of back: Secondary | ICD-10-CM | POA: Diagnosis not present

## 2019-12-16 DIAGNOSIS — M9902 Segmental and somatic dysfunction of thoracic region: Secondary | ICD-10-CM | POA: Diagnosis not present

## 2019-12-16 DIAGNOSIS — M50323 Other cervical disc degeneration at C6-C7 level: Secondary | ICD-10-CM | POA: Diagnosis not present

## 2020-01-14 DIAGNOSIS — M546 Pain in thoracic spine: Secondary | ICD-10-CM | POA: Diagnosis not present

## 2020-01-14 DIAGNOSIS — M50323 Other cervical disc degeneration at C6-C7 level: Secondary | ICD-10-CM | POA: Diagnosis not present

## 2020-01-14 DIAGNOSIS — M62830 Muscle spasm of back: Secondary | ICD-10-CM | POA: Diagnosis not present

## 2020-01-14 DIAGNOSIS — M9902 Segmental and somatic dysfunction of thoracic region: Secondary | ICD-10-CM | POA: Diagnosis not present

## 2020-01-14 DIAGNOSIS — M5413 Radiculopathy, cervicothoracic region: Secondary | ICD-10-CM | POA: Diagnosis not present

## 2020-01-14 DIAGNOSIS — M5124 Other intervertebral disc displacement, thoracic region: Secondary | ICD-10-CM | POA: Diagnosis not present

## 2020-01-14 DIAGNOSIS — M9901 Segmental and somatic dysfunction of cervical region: Secondary | ICD-10-CM | POA: Diagnosis not present

## 2020-01-15 DIAGNOSIS — M170 Bilateral primary osteoarthritis of knee: Secondary | ICD-10-CM | POA: Diagnosis not present

## 2020-01-19 DIAGNOSIS — M25562 Pain in left knee: Secondary | ICD-10-CM | POA: Diagnosis not present

## 2020-01-19 DIAGNOSIS — M25561 Pain in right knee: Secondary | ICD-10-CM | POA: Diagnosis not present

## 2020-01-19 DIAGNOSIS — M6281 Muscle weakness (generalized): Secondary | ICD-10-CM | POA: Diagnosis not present

## 2020-01-21 DIAGNOSIS — M25561 Pain in right knee: Secondary | ICD-10-CM | POA: Diagnosis not present

## 2020-01-21 DIAGNOSIS — M6281 Muscle weakness (generalized): Secondary | ICD-10-CM | POA: Diagnosis not present

## 2020-01-21 DIAGNOSIS — M25562 Pain in left knee: Secondary | ICD-10-CM | POA: Diagnosis not present

## 2020-01-25 DIAGNOSIS — M25561 Pain in right knee: Secondary | ICD-10-CM | POA: Diagnosis not present

## 2020-01-25 DIAGNOSIS — M6281 Muscle weakness (generalized): Secondary | ICD-10-CM | POA: Diagnosis not present

## 2020-01-25 DIAGNOSIS — M25562 Pain in left knee: Secondary | ICD-10-CM | POA: Diagnosis not present

## 2020-01-27 DIAGNOSIS — M6281 Muscle weakness (generalized): Secondary | ICD-10-CM | POA: Diagnosis not present

## 2020-01-27 DIAGNOSIS — M25562 Pain in left knee: Secondary | ICD-10-CM | POA: Diagnosis not present

## 2020-01-27 DIAGNOSIS — M25561 Pain in right knee: Secondary | ICD-10-CM | POA: Diagnosis not present

## 2020-02-03 DIAGNOSIS — M25561 Pain in right knee: Secondary | ICD-10-CM | POA: Diagnosis not present

## 2020-02-03 DIAGNOSIS — M25562 Pain in left knee: Secondary | ICD-10-CM | POA: Diagnosis not present

## 2020-02-03 DIAGNOSIS — M6281 Muscle weakness (generalized): Secondary | ICD-10-CM | POA: Diagnosis not present

## 2020-02-08 DIAGNOSIS — M25561 Pain in right knee: Secondary | ICD-10-CM | POA: Diagnosis not present

## 2020-02-08 DIAGNOSIS — M6281 Muscle weakness (generalized): Secondary | ICD-10-CM | POA: Diagnosis not present

## 2020-02-08 DIAGNOSIS — M25562 Pain in left knee: Secondary | ICD-10-CM | POA: Diagnosis not present

## 2020-02-10 DIAGNOSIS — M25561 Pain in right knee: Secondary | ICD-10-CM | POA: Diagnosis not present

## 2020-02-10 DIAGNOSIS — M25562 Pain in left knee: Secondary | ICD-10-CM | POA: Diagnosis not present

## 2020-02-10 DIAGNOSIS — M6281 Muscle weakness (generalized): Secondary | ICD-10-CM | POA: Diagnosis not present

## 2020-02-15 DIAGNOSIS — M25562 Pain in left knee: Secondary | ICD-10-CM | POA: Diagnosis not present

## 2020-02-15 DIAGNOSIS — M25561 Pain in right knee: Secondary | ICD-10-CM | POA: Diagnosis not present

## 2020-02-15 DIAGNOSIS — M6281 Muscle weakness (generalized): Secondary | ICD-10-CM | POA: Diagnosis not present

## 2020-02-17 DIAGNOSIS — M25562 Pain in left knee: Secondary | ICD-10-CM | POA: Diagnosis not present

## 2020-02-17 DIAGNOSIS — M6281 Muscle weakness (generalized): Secondary | ICD-10-CM | POA: Diagnosis not present

## 2020-02-17 DIAGNOSIS — M25561 Pain in right knee: Secondary | ICD-10-CM | POA: Diagnosis not present

## 2020-02-18 DIAGNOSIS — Z125 Encounter for screening for malignant neoplasm of prostate: Secondary | ICD-10-CM | POA: Diagnosis not present

## 2020-02-18 DIAGNOSIS — N400 Enlarged prostate without lower urinary tract symptoms: Secondary | ICD-10-CM | POA: Diagnosis not present

## 2020-02-18 DIAGNOSIS — I10 Essential (primary) hypertension: Secondary | ICD-10-CM | POA: Diagnosis not present

## 2020-02-18 DIAGNOSIS — G8929 Other chronic pain: Secondary | ICD-10-CM | POA: Diagnosis not present

## 2020-02-18 DIAGNOSIS — E118 Type 2 diabetes mellitus with unspecified complications: Secondary | ICD-10-CM | POA: Diagnosis not present

## 2020-02-18 DIAGNOSIS — E7800 Pure hypercholesterolemia, unspecified: Secondary | ICD-10-CM | POA: Diagnosis not present

## 2020-02-18 DIAGNOSIS — G4733 Obstructive sleep apnea (adult) (pediatric): Secondary | ICD-10-CM | POA: Diagnosis not present

## 2020-02-18 DIAGNOSIS — Z79899 Other long term (current) drug therapy: Secondary | ICD-10-CM | POA: Diagnosis not present

## 2020-02-18 DIAGNOSIS — M545 Low back pain, unspecified: Secondary | ICD-10-CM | POA: Diagnosis not present

## 2020-02-18 DIAGNOSIS — Z6841 Body Mass Index (BMI) 40.0 and over, adult: Secondary | ICD-10-CM | POA: Diagnosis not present

## 2020-02-22 DIAGNOSIS — M25561 Pain in right knee: Secondary | ICD-10-CM | POA: Diagnosis not present

## 2020-02-22 DIAGNOSIS — M6281 Muscle weakness (generalized): Secondary | ICD-10-CM | POA: Diagnosis not present

## 2020-02-22 DIAGNOSIS — M25562 Pain in left knee: Secondary | ICD-10-CM | POA: Diagnosis not present

## 2020-02-24 DIAGNOSIS — M25561 Pain in right knee: Secondary | ICD-10-CM | POA: Diagnosis not present

## 2020-02-24 DIAGNOSIS — M25562 Pain in left knee: Secondary | ICD-10-CM | POA: Diagnosis not present

## 2020-02-24 DIAGNOSIS — M6281 Muscle weakness (generalized): Secondary | ICD-10-CM | POA: Diagnosis not present

## 2020-02-29 DIAGNOSIS — M6281 Muscle weakness (generalized): Secondary | ICD-10-CM | POA: Diagnosis not present

## 2020-02-29 DIAGNOSIS — M25561 Pain in right knee: Secondary | ICD-10-CM | POA: Diagnosis not present

## 2020-02-29 DIAGNOSIS — M25562 Pain in left knee: Secondary | ICD-10-CM | POA: Diagnosis not present

## 2020-03-03 DIAGNOSIS — M6281 Muscle weakness (generalized): Secondary | ICD-10-CM | POA: Diagnosis not present

## 2020-03-03 DIAGNOSIS — M25562 Pain in left knee: Secondary | ICD-10-CM | POA: Diagnosis not present

## 2020-03-03 DIAGNOSIS — M25561 Pain in right knee: Secondary | ICD-10-CM | POA: Diagnosis not present

## 2020-03-07 DIAGNOSIS — M25562 Pain in left knee: Secondary | ICD-10-CM | POA: Diagnosis not present

## 2020-03-07 DIAGNOSIS — M25561 Pain in right knee: Secondary | ICD-10-CM | POA: Diagnosis not present

## 2020-03-07 DIAGNOSIS — M6281 Muscle weakness (generalized): Secondary | ICD-10-CM | POA: Diagnosis not present

## 2020-03-09 DIAGNOSIS — M25562 Pain in left knee: Secondary | ICD-10-CM | POA: Diagnosis not present

## 2020-03-09 DIAGNOSIS — M6281 Muscle weakness (generalized): Secondary | ICD-10-CM | POA: Diagnosis not present

## 2020-03-09 DIAGNOSIS — M25561 Pain in right knee: Secondary | ICD-10-CM | POA: Diagnosis not present

## 2020-03-14 DIAGNOSIS — M25561 Pain in right knee: Secondary | ICD-10-CM | POA: Diagnosis not present

## 2020-03-14 DIAGNOSIS — M25562 Pain in left knee: Secondary | ICD-10-CM | POA: Diagnosis not present

## 2020-03-14 DIAGNOSIS — M6281 Muscle weakness (generalized): Secondary | ICD-10-CM | POA: Diagnosis not present

## 2020-03-16 DIAGNOSIS — M9901 Segmental and somatic dysfunction of cervical region: Secondary | ICD-10-CM | POA: Diagnosis not present

## 2020-03-16 DIAGNOSIS — M50323 Other cervical disc degeneration at C6-C7 level: Secondary | ICD-10-CM | POA: Diagnosis not present

## 2020-03-16 DIAGNOSIS — M25561 Pain in right knee: Secondary | ICD-10-CM | POA: Diagnosis not present

## 2020-03-16 DIAGNOSIS — M5413 Radiculopathy, cervicothoracic region: Secondary | ICD-10-CM | POA: Diagnosis not present

## 2020-03-16 DIAGNOSIS — M9902 Segmental and somatic dysfunction of thoracic region: Secondary | ICD-10-CM | POA: Diagnosis not present

## 2020-03-16 DIAGNOSIS — M62830 Muscle spasm of back: Secondary | ICD-10-CM | POA: Diagnosis not present

## 2020-03-16 DIAGNOSIS — M546 Pain in thoracic spine: Secondary | ICD-10-CM | POA: Diagnosis not present

## 2020-03-16 DIAGNOSIS — M25562 Pain in left knee: Secondary | ICD-10-CM | POA: Diagnosis not present

## 2020-03-16 DIAGNOSIS — M5124 Other intervertebral disc displacement, thoracic region: Secondary | ICD-10-CM | POA: Diagnosis not present

## 2020-03-16 DIAGNOSIS — M6281 Muscle weakness (generalized): Secondary | ICD-10-CM | POA: Diagnosis not present

## 2020-03-21 DIAGNOSIS — M25561 Pain in right knee: Secondary | ICD-10-CM | POA: Diagnosis not present

## 2020-03-21 DIAGNOSIS — M25562 Pain in left knee: Secondary | ICD-10-CM | POA: Diagnosis not present

## 2020-03-21 DIAGNOSIS — M6281 Muscle weakness (generalized): Secondary | ICD-10-CM | POA: Diagnosis not present

## 2020-03-23 DIAGNOSIS — M25562 Pain in left knee: Secondary | ICD-10-CM | POA: Diagnosis not present

## 2020-03-23 DIAGNOSIS — M25561 Pain in right knee: Secondary | ICD-10-CM | POA: Diagnosis not present

## 2020-03-23 DIAGNOSIS — M6281 Muscle weakness (generalized): Secondary | ICD-10-CM | POA: Diagnosis not present

## 2020-03-30 DIAGNOSIS — M6281 Muscle weakness (generalized): Secondary | ICD-10-CM | POA: Diagnosis not present

## 2020-03-30 DIAGNOSIS — M25561 Pain in right knee: Secondary | ICD-10-CM | POA: Diagnosis not present

## 2020-03-30 DIAGNOSIS — M25562 Pain in left knee: Secondary | ICD-10-CM | POA: Diagnosis not present

## 2020-04-06 DIAGNOSIS — M6281 Muscle weakness (generalized): Secondary | ICD-10-CM | POA: Diagnosis not present

## 2020-04-06 DIAGNOSIS — M25562 Pain in left knee: Secondary | ICD-10-CM | POA: Diagnosis not present

## 2020-04-06 DIAGNOSIS — M25561 Pain in right knee: Secondary | ICD-10-CM | POA: Diagnosis not present

## 2020-04-12 DIAGNOSIS — M62830 Muscle spasm of back: Secondary | ICD-10-CM | POA: Diagnosis not present

## 2020-04-12 DIAGNOSIS — M5413 Radiculopathy, cervicothoracic region: Secondary | ICD-10-CM | POA: Diagnosis not present

## 2020-04-12 DIAGNOSIS — M9902 Segmental and somatic dysfunction of thoracic region: Secondary | ICD-10-CM | POA: Diagnosis not present

## 2020-04-12 DIAGNOSIS — M50323 Other cervical disc degeneration at C6-C7 level: Secondary | ICD-10-CM | POA: Diagnosis not present

## 2020-04-12 DIAGNOSIS — M546 Pain in thoracic spine: Secondary | ICD-10-CM | POA: Diagnosis not present

## 2020-04-12 DIAGNOSIS — M5124 Other intervertebral disc displacement, thoracic region: Secondary | ICD-10-CM | POA: Diagnosis not present

## 2020-04-12 DIAGNOSIS — M9901 Segmental and somatic dysfunction of cervical region: Secondary | ICD-10-CM | POA: Diagnosis not present

## 2020-04-13 DIAGNOSIS — M25562 Pain in left knee: Secondary | ICD-10-CM | POA: Diagnosis not present

## 2020-04-13 DIAGNOSIS — M25561 Pain in right knee: Secondary | ICD-10-CM | POA: Diagnosis not present

## 2020-04-13 DIAGNOSIS — M6281 Muscle weakness (generalized): Secondary | ICD-10-CM | POA: Diagnosis not present

## 2020-05-19 DIAGNOSIS — M62830 Muscle spasm of back: Secondary | ICD-10-CM | POA: Diagnosis not present

## 2020-05-19 DIAGNOSIS — M9902 Segmental and somatic dysfunction of thoracic region: Secondary | ICD-10-CM | POA: Diagnosis not present

## 2020-05-19 DIAGNOSIS — M50323 Other cervical disc degeneration at C6-C7 level: Secondary | ICD-10-CM | POA: Diagnosis not present

## 2020-05-19 DIAGNOSIS — M5124 Other intervertebral disc displacement, thoracic region: Secondary | ICD-10-CM | POA: Diagnosis not present

## 2020-05-19 DIAGNOSIS — M9901 Segmental and somatic dysfunction of cervical region: Secondary | ICD-10-CM | POA: Diagnosis not present

## 2020-05-19 DIAGNOSIS — M546 Pain in thoracic spine: Secondary | ICD-10-CM | POA: Diagnosis not present

## 2020-05-19 DIAGNOSIS — M5413 Radiculopathy, cervicothoracic region: Secondary | ICD-10-CM | POA: Diagnosis not present

## 2020-05-23 DIAGNOSIS — M170 Bilateral primary osteoarthritis of knee: Secondary | ICD-10-CM | POA: Diagnosis not present

## 2020-05-23 DIAGNOSIS — M1711 Unilateral primary osteoarthritis, right knee: Secondary | ICD-10-CM | POA: Diagnosis not present

## 2020-05-26 DIAGNOSIS — Z79899 Other long term (current) drug therapy: Secondary | ICD-10-CM | POA: Diagnosis not present

## 2020-05-26 DIAGNOSIS — Z6841 Body Mass Index (BMI) 40.0 and over, adult: Secondary | ICD-10-CM | POA: Diagnosis not present

## 2020-05-26 DIAGNOSIS — N400 Enlarged prostate without lower urinary tract symptoms: Secondary | ICD-10-CM | POA: Diagnosis not present

## 2020-05-26 DIAGNOSIS — E7800 Pure hypercholesterolemia, unspecified: Secondary | ICD-10-CM | POA: Diagnosis not present

## 2020-05-26 DIAGNOSIS — I10 Essential (primary) hypertension: Secondary | ICD-10-CM | POA: Diagnosis not present

## 2020-05-26 DIAGNOSIS — E118 Type 2 diabetes mellitus with unspecified complications: Secondary | ICD-10-CM | POA: Diagnosis not present

## 2020-05-26 DIAGNOSIS — E119 Type 2 diabetes mellitus without complications: Secondary | ICD-10-CM | POA: Diagnosis not present

## 2020-05-26 DIAGNOSIS — M5416 Radiculopathy, lumbar region: Secondary | ICD-10-CM | POA: Diagnosis not present

## 2020-05-26 DIAGNOSIS — H2513 Age-related nuclear cataract, bilateral: Secondary | ICD-10-CM | POA: Diagnosis not present

## 2020-05-26 DIAGNOSIS — M170 Bilateral primary osteoarthritis of knee: Secondary | ICD-10-CM | POA: Diagnosis not present

## 2020-06-14 DIAGNOSIS — M50323 Other cervical disc degeneration at C6-C7 level: Secondary | ICD-10-CM | POA: Diagnosis not present

## 2020-06-14 DIAGNOSIS — M546 Pain in thoracic spine: Secondary | ICD-10-CM | POA: Diagnosis not present

## 2020-06-14 DIAGNOSIS — M62830 Muscle spasm of back: Secondary | ICD-10-CM | POA: Diagnosis not present

## 2020-06-14 DIAGNOSIS — M5124 Other intervertebral disc displacement, thoracic region: Secondary | ICD-10-CM | POA: Diagnosis not present

## 2020-06-14 DIAGNOSIS — M9901 Segmental and somatic dysfunction of cervical region: Secondary | ICD-10-CM | POA: Diagnosis not present

## 2020-06-14 DIAGNOSIS — M9902 Segmental and somatic dysfunction of thoracic region: Secondary | ICD-10-CM | POA: Diagnosis not present

## 2020-06-14 DIAGNOSIS — M5413 Radiculopathy, cervicothoracic region: Secondary | ICD-10-CM | POA: Diagnosis not present

## 2020-07-07 DIAGNOSIS — M170 Bilateral primary osteoarthritis of knee: Secondary | ICD-10-CM | POA: Diagnosis not present

## 2020-07-07 DIAGNOSIS — G8929 Other chronic pain: Secondary | ICD-10-CM | POA: Diagnosis not present

## 2020-07-07 DIAGNOSIS — Z01818 Encounter for other preprocedural examination: Secondary | ICD-10-CM | POA: Diagnosis not present

## 2020-07-07 DIAGNOSIS — I10 Essential (primary) hypertension: Secondary | ICD-10-CM | POA: Diagnosis not present

## 2020-07-07 DIAGNOSIS — N400 Enlarged prostate without lower urinary tract symptoms: Secondary | ICD-10-CM | POA: Diagnosis not present

## 2020-07-07 DIAGNOSIS — Z6839 Body mass index (BMI) 39.0-39.9, adult: Secondary | ICD-10-CM | POA: Diagnosis not present

## 2020-07-07 DIAGNOSIS — M545 Low back pain, unspecified: Secondary | ICD-10-CM | POA: Diagnosis not present

## 2020-07-07 DIAGNOSIS — G4733 Obstructive sleep apnea (adult) (pediatric): Secondary | ICD-10-CM | POA: Diagnosis not present

## 2020-07-07 DIAGNOSIS — E7800 Pure hypercholesterolemia, unspecified: Secondary | ICD-10-CM | POA: Diagnosis not present

## 2020-07-07 DIAGNOSIS — Z0183 Encounter for blood typing: Secondary | ICD-10-CM | POA: Diagnosis not present

## 2020-07-07 DIAGNOSIS — E118 Type 2 diabetes mellitus with unspecified complications: Secondary | ICD-10-CM | POA: Diagnosis not present

## 2020-07-10 ENCOUNTER — Other Ambulatory Visit: Payer: Self-pay | Admitting: Urology

## 2020-07-26 DIAGNOSIS — L820 Inflamed seborrheic keratosis: Secondary | ICD-10-CM | POA: Diagnosis not present

## 2020-07-26 DIAGNOSIS — L918 Other hypertrophic disorders of the skin: Secondary | ICD-10-CM | POA: Diagnosis not present

## 2020-07-26 DIAGNOSIS — L821 Other seborrheic keratosis: Secondary | ICD-10-CM | POA: Diagnosis not present

## 2020-07-27 DIAGNOSIS — M9902 Segmental and somatic dysfunction of thoracic region: Secondary | ICD-10-CM | POA: Diagnosis not present

## 2020-07-27 DIAGNOSIS — M5124 Other intervertebral disc displacement, thoracic region: Secondary | ICD-10-CM | POA: Diagnosis not present

## 2020-07-27 DIAGNOSIS — M62830 Muscle spasm of back: Secondary | ICD-10-CM | POA: Diagnosis not present

## 2020-07-27 DIAGNOSIS — M5413 Radiculopathy, cervicothoracic region: Secondary | ICD-10-CM | POA: Diagnosis not present

## 2020-07-27 DIAGNOSIS — M50323 Other cervical disc degeneration at C6-C7 level: Secondary | ICD-10-CM | POA: Diagnosis not present

## 2020-07-27 DIAGNOSIS — M9901 Segmental and somatic dysfunction of cervical region: Secondary | ICD-10-CM | POA: Diagnosis not present

## 2020-07-27 DIAGNOSIS — M546 Pain in thoracic spine: Secondary | ICD-10-CM | POA: Diagnosis not present

## 2020-08-03 DIAGNOSIS — Z20822 Contact with and (suspected) exposure to covid-19: Secondary | ICD-10-CM | POA: Diagnosis not present

## 2020-08-11 DIAGNOSIS — Z1331 Encounter for screening for depression: Secondary | ICD-10-CM | POA: Diagnosis not present

## 2020-08-11 DIAGNOSIS — Z6838 Body mass index (BMI) 38.0-38.9, adult: Secondary | ICD-10-CM | POA: Diagnosis not present

## 2020-08-11 DIAGNOSIS — Z9181 History of falling: Secondary | ICD-10-CM | POA: Diagnosis not present

## 2020-08-11 DIAGNOSIS — Z23 Encounter for immunization: Secondary | ICD-10-CM | POA: Diagnosis not present

## 2020-08-11 DIAGNOSIS — E118 Type 2 diabetes mellitus with unspecified complications: Secondary | ICD-10-CM | POA: Diagnosis not present

## 2020-08-11 DIAGNOSIS — M5416 Radiculopathy, lumbar region: Secondary | ICD-10-CM | POA: Diagnosis not present

## 2020-08-11 DIAGNOSIS — M170 Bilateral primary osteoarthritis of knee: Secondary | ICD-10-CM | POA: Diagnosis not present

## 2020-08-19 DIAGNOSIS — M25561 Pain in right knee: Secondary | ICD-10-CM | POA: Diagnosis not present

## 2020-08-23 DIAGNOSIS — M9902 Segmental and somatic dysfunction of thoracic region: Secondary | ICD-10-CM | POA: Diagnosis not present

## 2020-08-23 DIAGNOSIS — M50323 Other cervical disc degeneration at C6-C7 level: Secondary | ICD-10-CM | POA: Diagnosis not present

## 2020-08-23 DIAGNOSIS — M5413 Radiculopathy, cervicothoracic region: Secondary | ICD-10-CM | POA: Diagnosis not present

## 2020-08-23 DIAGNOSIS — M546 Pain in thoracic spine: Secondary | ICD-10-CM | POA: Diagnosis not present

## 2020-08-23 DIAGNOSIS — M5124 Other intervertebral disc displacement, thoracic region: Secondary | ICD-10-CM | POA: Diagnosis not present

## 2020-08-23 DIAGNOSIS — M62830 Muscle spasm of back: Secondary | ICD-10-CM | POA: Diagnosis not present

## 2020-08-23 DIAGNOSIS — M9901 Segmental and somatic dysfunction of cervical region: Secondary | ICD-10-CM | POA: Diagnosis not present

## 2020-08-26 DIAGNOSIS — Z01818 Encounter for other preprocedural examination: Secondary | ICD-10-CM | POA: Diagnosis not present

## 2020-09-13 DIAGNOSIS — G8918 Other acute postprocedural pain: Secondary | ICD-10-CM | POA: Diagnosis not present

## 2020-09-13 DIAGNOSIS — M1711 Unilateral primary osteoarthritis, right knee: Secondary | ICD-10-CM | POA: Diagnosis not present

## 2020-09-16 DIAGNOSIS — M25561 Pain in right knee: Secondary | ICD-10-CM | POA: Insufficient documentation

## 2020-09-19 DIAGNOSIS — R822 Biliuria: Secondary | ICD-10-CM | POA: Diagnosis not present

## 2020-09-19 DIAGNOSIS — M25561 Pain in right knee: Secondary | ICD-10-CM | POA: Diagnosis not present

## 2020-09-19 DIAGNOSIS — Z6837 Body mass index (BMI) 37.0-37.9, adult: Secondary | ICD-10-CM | POA: Diagnosis not present

## 2020-09-21 DIAGNOSIS — M25561 Pain in right knee: Secondary | ICD-10-CM | POA: Diagnosis not present

## 2020-09-23 DIAGNOSIS — M25561 Pain in right knee: Secondary | ICD-10-CM | POA: Diagnosis not present

## 2020-09-27 ENCOUNTER — Ambulatory Visit (HOSPITAL_COMMUNITY)
Admission: RE | Admit: 2020-09-27 | Discharge: 2020-09-27 | Disposition: A | Discharge status: Home / Self Care | Payer: Medicare Other | Source: Ambulatory Visit | Attending: Cardiology | Admitting: Cardiology

## 2020-09-27 ENCOUNTER — Telehealth: Payer: Self-pay | Admitting: *Deleted

## 2020-09-27 ENCOUNTER — Other Ambulatory Visit: Payer: Self-pay

## 2020-09-27 ENCOUNTER — Encounter (HOSPITAL_COMMUNITY): Payer: Self-pay

## 2020-09-27 ENCOUNTER — Other Ambulatory Visit (HOSPITAL_COMMUNITY): Payer: Self-pay | Admitting: Specialist

## 2020-09-27 DIAGNOSIS — M79605 Pain in left leg: Secondary | ICD-10-CM | POA: Insufficient documentation

## 2020-09-27 DIAGNOSIS — M79662 Pain in left lower leg: Secondary | ICD-10-CM | POA: Diagnosis not present

## 2020-09-27 DIAGNOSIS — M79661 Pain in right lower leg: Secondary | ICD-10-CM | POA: Diagnosis not present

## 2020-09-27 DIAGNOSIS — Z96651 Presence of right artificial knee joint: Secondary | ICD-10-CM | POA: Diagnosis not present

## 2020-09-27 DIAGNOSIS — I82401 Acute embolism and thrombosis of unspecified deep veins of right lower extremity: Secondary | ICD-10-CM | POA: Diagnosis not present

## 2020-09-27 DIAGNOSIS — M79604 Pain in right leg: Secondary | ICD-10-CM

## 2020-09-27 DIAGNOSIS — Z6837 Body mass index (BMI) 37.0-37.9, adult: Secondary | ICD-10-CM | POA: Diagnosis not present

## 2020-09-27 NOTE — Telephone Encounter (Signed)
Bonnetta Barry, RN Caller: Unspecified (Today,  1:02 PM) The DVT  test was just done today , when it has been signed and is final I can fax to [facility]. 09/27/20 fsw         Spoke with [facility] staff and advised results will be faxed once report is finalized

## 2020-09-27 NOTE — Telephone Encounter (Signed)
Message routed to medical records.

## 2020-09-27 NOTE — Telephone Encounter (Signed)
Vicente Males with [facility] is requesting to have the patient's DVT results faxed to their office at [phone].

## 2020-09-27 NOTE — Progress Notes (Signed)
Right lower extremity venous duplex test completed. See results under Chart Review-CV proc. Spoke with Rolla Plate, PA-C at [facility], and patient was instructed to call PCP for further instructions and/or treatment. Final report to follow.

## 2020-09-28 DIAGNOSIS — Z471 Aftercare following joint replacement surgery: Secondary | ICD-10-CM | POA: Diagnosis not present

## 2020-09-28 DIAGNOSIS — Z4789 Encounter for other orthopedic aftercare: Secondary | ICD-10-CM | POA: Diagnosis not present

## 2020-09-28 DIAGNOSIS — M25561 Pain in right knee: Secondary | ICD-10-CM | POA: Diagnosis not present

## 2020-09-30 DIAGNOSIS — M25561 Pain in right knee: Secondary | ICD-10-CM | POA: Diagnosis not present

## 2020-10-04 DIAGNOSIS — M25561 Pain in right knee: Secondary | ICD-10-CM | POA: Diagnosis not present

## 2020-10-07 DIAGNOSIS — M25561 Pain in right knee: Secondary | ICD-10-CM | POA: Diagnosis not present

## 2020-10-11 DIAGNOSIS — M25561 Pain in right knee: Secondary | ICD-10-CM | POA: Diagnosis not present

## 2020-10-13 DIAGNOSIS — M25561 Pain in right knee: Secondary | ICD-10-CM | POA: Diagnosis not present

## 2020-10-18 DIAGNOSIS — M25561 Pain in right knee: Secondary | ICD-10-CM | POA: Diagnosis not present

## 2020-10-21 DIAGNOSIS — M25561 Pain in right knee: Secondary | ICD-10-CM | POA: Diagnosis not present

## 2020-10-26 DIAGNOSIS — M25561 Pain in right knee: Secondary | ICD-10-CM | POA: Diagnosis not present

## 2020-10-28 DIAGNOSIS — M25561 Pain in right knee: Secondary | ICD-10-CM | POA: Diagnosis not present

## 2020-10-31 DIAGNOSIS — M25561 Pain in right knee: Secondary | ICD-10-CM | POA: Diagnosis not present

## 2020-11-01 DIAGNOSIS — R609 Edema, unspecified: Secondary | ICD-10-CM | POA: Diagnosis not present

## 2020-11-01 DIAGNOSIS — Z6838 Body mass index (BMI) 38.0-38.9, adult: Secondary | ICD-10-CM | POA: Diagnosis not present

## 2020-11-01 DIAGNOSIS — I82401 Acute embolism and thrombosis of unspecified deep veins of right lower extremity: Secondary | ICD-10-CM | POA: Diagnosis not present

## 2020-11-01 DIAGNOSIS — Z23 Encounter for immunization: Secondary | ICD-10-CM | POA: Diagnosis not present

## 2020-11-01 DIAGNOSIS — E118 Type 2 diabetes mellitus with unspecified complications: Secondary | ICD-10-CM | POA: Diagnosis not present

## 2020-11-01 DIAGNOSIS — R002 Palpitations: Secondary | ICD-10-CM | POA: Diagnosis not present

## 2020-11-04 DIAGNOSIS — M25561 Pain in right knee: Secondary | ICD-10-CM | POA: Diagnosis not present

## 2020-11-07 DIAGNOSIS — M9902 Segmental and somatic dysfunction of thoracic region: Secondary | ICD-10-CM | POA: Diagnosis not present

## 2020-11-07 DIAGNOSIS — M5124 Other intervertebral disc displacement, thoracic region: Secondary | ICD-10-CM | POA: Diagnosis not present

## 2020-11-07 DIAGNOSIS — M50323 Other cervical disc degeneration at C6-C7 level: Secondary | ICD-10-CM | POA: Diagnosis not present

## 2020-11-07 DIAGNOSIS — M5413 Radiculopathy, cervicothoracic region: Secondary | ICD-10-CM | POA: Diagnosis not present

## 2020-11-07 DIAGNOSIS — M9901 Segmental and somatic dysfunction of cervical region: Secondary | ICD-10-CM | POA: Diagnosis not present

## 2020-11-07 DIAGNOSIS — M546 Pain in thoracic spine: Secondary | ICD-10-CM | POA: Diagnosis not present

## 2020-11-07 DIAGNOSIS — M62830 Muscle spasm of back: Secondary | ICD-10-CM | POA: Diagnosis not present

## 2020-11-08 DIAGNOSIS — M25561 Pain in right knee: Secondary | ICD-10-CM | POA: Diagnosis not present

## 2020-11-11 DIAGNOSIS — M25561 Pain in right knee: Secondary | ICD-10-CM | POA: Diagnosis not present

## 2020-11-15 DIAGNOSIS — M25561 Pain in right knee: Secondary | ICD-10-CM | POA: Diagnosis not present

## 2020-11-18 DIAGNOSIS — M25561 Pain in right knee: Secondary | ICD-10-CM | POA: Diagnosis not present

## 2020-11-22 DIAGNOSIS — M25561 Pain in right knee: Secondary | ICD-10-CM | POA: Diagnosis not present

## 2020-11-25 DIAGNOSIS — M25561 Pain in right knee: Secondary | ICD-10-CM | POA: Diagnosis not present

## 2020-11-29 DIAGNOSIS — M25561 Pain in right knee: Secondary | ICD-10-CM | POA: Diagnosis not present

## 2020-12-02 DIAGNOSIS — M25561 Pain in right knee: Secondary | ICD-10-CM | POA: Diagnosis not present

## 2020-12-05 DIAGNOSIS — M546 Pain in thoracic spine: Secondary | ICD-10-CM | POA: Diagnosis not present

## 2020-12-05 DIAGNOSIS — M50323 Other cervical disc degeneration at C6-C7 level: Secondary | ICD-10-CM | POA: Diagnosis not present

## 2020-12-05 DIAGNOSIS — M5413 Radiculopathy, cervicothoracic region: Secondary | ICD-10-CM | POA: Diagnosis not present

## 2020-12-05 DIAGNOSIS — M62830 Muscle spasm of back: Secondary | ICD-10-CM | POA: Diagnosis not present

## 2020-12-05 DIAGNOSIS — M9901 Segmental and somatic dysfunction of cervical region: Secondary | ICD-10-CM | POA: Diagnosis not present

## 2020-12-05 DIAGNOSIS — M9902 Segmental and somatic dysfunction of thoracic region: Secondary | ICD-10-CM | POA: Diagnosis not present

## 2020-12-05 DIAGNOSIS — M5124 Other intervertebral disc displacement, thoracic region: Secondary | ICD-10-CM | POA: Diagnosis not present

## 2020-12-06 DIAGNOSIS — M25561 Pain in right knee: Secondary | ICD-10-CM | POA: Diagnosis not present

## 2020-12-07 DIAGNOSIS — Z4789 Encounter for other orthopedic aftercare: Secondary | ICD-10-CM | POA: Diagnosis not present

## 2020-12-08 DIAGNOSIS — Z6837 Body mass index (BMI) 37.0-37.9, adult: Secondary | ICD-10-CM | POA: Diagnosis not present

## 2020-12-08 DIAGNOSIS — M170 Bilateral primary osteoarthritis of knee: Secondary | ICD-10-CM | POA: Diagnosis not present

## 2020-12-08 DIAGNOSIS — Z139 Encounter for screening, unspecified: Secondary | ICD-10-CM | POA: Diagnosis not present

## 2020-12-08 DIAGNOSIS — E118 Type 2 diabetes mellitus with unspecified complications: Secondary | ICD-10-CM | POA: Diagnosis not present

## 2020-12-08 DIAGNOSIS — E7800 Pure hypercholesterolemia, unspecified: Secondary | ICD-10-CM | POA: Diagnosis not present

## 2020-12-08 DIAGNOSIS — I82401 Acute embolism and thrombosis of unspecified deep veins of right lower extremity: Secondary | ICD-10-CM | POA: Diagnosis not present

## 2020-12-08 DIAGNOSIS — M5416 Radiculopathy, lumbar region: Secondary | ICD-10-CM | POA: Diagnosis not present

## 2020-12-08 DIAGNOSIS — I10 Essential (primary) hypertension: Secondary | ICD-10-CM | POA: Diagnosis not present

## 2020-12-08 DIAGNOSIS — N400 Enlarged prostate without lower urinary tract symptoms: Secondary | ICD-10-CM | POA: Diagnosis not present

## 2021-01-01 DIAGNOSIS — Z20822 Contact with and (suspected) exposure to covid-19: Secondary | ICD-10-CM | POA: Diagnosis not present

## 2021-01-08 ENCOUNTER — Other Ambulatory Visit: Payer: Self-pay | Admitting: Urology

## 2021-01-10 DIAGNOSIS — M9902 Segmental and somatic dysfunction of thoracic region: Secondary | ICD-10-CM | POA: Diagnosis not present

## 2021-01-10 DIAGNOSIS — M546 Pain in thoracic spine: Secondary | ICD-10-CM | POA: Diagnosis not present

## 2021-01-10 DIAGNOSIS — M5413 Radiculopathy, cervicothoracic region: Secondary | ICD-10-CM | POA: Diagnosis not present

## 2021-01-10 DIAGNOSIS — M50323 Other cervical disc degeneration at C6-C7 level: Secondary | ICD-10-CM | POA: Diagnosis not present

## 2021-01-10 DIAGNOSIS — M62830 Muscle spasm of back: Secondary | ICD-10-CM | POA: Diagnosis not present

## 2021-01-10 DIAGNOSIS — M9901 Segmental and somatic dysfunction of cervical region: Secondary | ICD-10-CM | POA: Diagnosis not present

## 2021-01-10 DIAGNOSIS — M5124 Other intervertebral disc displacement, thoracic region: Secondary | ICD-10-CM | POA: Diagnosis not present

## 2021-01-31 DIAGNOSIS — I498 Other specified cardiac arrhythmias: Secondary | ICD-10-CM | POA: Diagnosis not present

## 2021-01-31 DIAGNOSIS — M5416 Radiculopathy, lumbar region: Secondary | ICD-10-CM | POA: Diagnosis not present

## 2021-01-31 DIAGNOSIS — Z6836 Body mass index (BMI) 36.0-36.9, adult: Secondary | ICD-10-CM | POA: Diagnosis not present

## 2021-01-31 DIAGNOSIS — J019 Acute sinusitis, unspecified: Secondary | ICD-10-CM | POA: Diagnosis not present

## 2021-02-21 DIAGNOSIS — M5413 Radiculopathy, cervicothoracic region: Secondary | ICD-10-CM | POA: Diagnosis not present

## 2021-02-21 DIAGNOSIS — M546 Pain in thoracic spine: Secondary | ICD-10-CM | POA: Diagnosis not present

## 2021-02-21 DIAGNOSIS — M62830 Muscle spasm of back: Secondary | ICD-10-CM | POA: Diagnosis not present

## 2021-02-21 DIAGNOSIS — M9901 Segmental and somatic dysfunction of cervical region: Secondary | ICD-10-CM | POA: Diagnosis not present

## 2021-02-21 DIAGNOSIS — M50323 Other cervical disc degeneration at C6-C7 level: Secondary | ICD-10-CM | POA: Diagnosis not present

## 2021-02-21 DIAGNOSIS — M9902 Segmental and somatic dysfunction of thoracic region: Secondary | ICD-10-CM | POA: Diagnosis not present

## 2021-02-21 DIAGNOSIS — M5124 Other intervertebral disc displacement, thoracic region: Secondary | ICD-10-CM | POA: Diagnosis not present

## 2021-03-01 DIAGNOSIS — Z4789 Encounter for other orthopedic aftercare: Secondary | ICD-10-CM | POA: Diagnosis not present

## 2021-03-14 DIAGNOSIS — J019 Acute sinusitis, unspecified: Secondary | ICD-10-CM | POA: Diagnosis not present

## 2021-03-14 DIAGNOSIS — M5416 Radiculopathy, lumbar region: Secondary | ICD-10-CM | POA: Diagnosis not present

## 2021-03-14 DIAGNOSIS — Z6837 Body mass index (BMI) 37.0-37.9, adult: Secondary | ICD-10-CM | POA: Diagnosis not present

## 2021-03-14 DIAGNOSIS — N400 Enlarged prostate without lower urinary tract symptoms: Secondary | ICD-10-CM | POA: Diagnosis not present

## 2021-03-14 DIAGNOSIS — E118 Type 2 diabetes mellitus with unspecified complications: Secondary | ICD-10-CM | POA: Diagnosis not present

## 2021-03-14 DIAGNOSIS — I10 Essential (primary) hypertension: Secondary | ICD-10-CM | POA: Diagnosis not present

## 2021-03-14 DIAGNOSIS — E7800 Pure hypercholesterolemia, unspecified: Secondary | ICD-10-CM | POA: Diagnosis not present

## 2021-03-14 DIAGNOSIS — M170 Bilateral primary osteoarthritis of knee: Secondary | ICD-10-CM | POA: Diagnosis not present

## 2021-03-27 DIAGNOSIS — M9902 Segmental and somatic dysfunction of thoracic region: Secondary | ICD-10-CM | POA: Diagnosis not present

## 2021-03-27 DIAGNOSIS — M50323 Other cervical disc degeneration at C6-C7 level: Secondary | ICD-10-CM | POA: Diagnosis not present

## 2021-03-27 DIAGNOSIS — M546 Pain in thoracic spine: Secondary | ICD-10-CM | POA: Diagnosis not present

## 2021-03-27 DIAGNOSIS — M9901 Segmental and somatic dysfunction of cervical region: Secondary | ICD-10-CM | POA: Diagnosis not present

## 2021-03-27 DIAGNOSIS — M5124 Other intervertebral disc displacement, thoracic region: Secondary | ICD-10-CM | POA: Diagnosis not present

## 2021-03-27 DIAGNOSIS — M62830 Muscle spasm of back: Secondary | ICD-10-CM | POA: Diagnosis not present

## 2021-03-27 DIAGNOSIS — M5413 Radiculopathy, cervicothoracic region: Secondary | ICD-10-CM | POA: Diagnosis not present

## 2021-04-26 DIAGNOSIS — M546 Pain in thoracic spine: Secondary | ICD-10-CM | POA: Diagnosis not present

## 2021-04-26 DIAGNOSIS — M5124 Other intervertebral disc displacement, thoracic region: Secondary | ICD-10-CM | POA: Diagnosis not present

## 2021-04-26 DIAGNOSIS — M9901 Segmental and somatic dysfunction of cervical region: Secondary | ICD-10-CM | POA: Diagnosis not present

## 2021-04-26 DIAGNOSIS — M9902 Segmental and somatic dysfunction of thoracic region: Secondary | ICD-10-CM | POA: Diagnosis not present

## 2021-04-26 DIAGNOSIS — M50323 Other cervical disc degeneration at C6-C7 level: Secondary | ICD-10-CM | POA: Diagnosis not present

## 2021-04-26 DIAGNOSIS — M62830 Muscle spasm of back: Secondary | ICD-10-CM | POA: Diagnosis not present

## 2021-04-26 DIAGNOSIS — M5413 Radiculopathy, cervicothoracic region: Secondary | ICD-10-CM | POA: Diagnosis not present

## 2021-05-03 DIAGNOSIS — J309 Allergic rhinitis, unspecified: Secondary | ICD-10-CM | POA: Diagnosis not present

## 2021-05-03 DIAGNOSIS — Z20822 Contact with and (suspected) exposure to covid-19: Secondary | ICD-10-CM | POA: Diagnosis not present

## 2021-05-03 DIAGNOSIS — J019 Acute sinusitis, unspecified: Secondary | ICD-10-CM | POA: Diagnosis not present

## 2021-05-03 DIAGNOSIS — Z6837 Body mass index (BMI) 37.0-37.9, adult: Secondary | ICD-10-CM | POA: Diagnosis not present

## 2021-05-03 DIAGNOSIS — R6889 Other general symptoms and signs: Secondary | ICD-10-CM | POA: Diagnosis not present

## 2021-05-26 DIAGNOSIS — Z20822 Contact with and (suspected) exposure to covid-19: Secondary | ICD-10-CM | POA: Diagnosis not present

## 2021-05-31 DIAGNOSIS — H2513 Age-related nuclear cataract, bilateral: Secondary | ICD-10-CM | POA: Diagnosis not present

## 2021-05-31 DIAGNOSIS — Z7984 Long term (current) use of oral hypoglycemic drugs: Secondary | ICD-10-CM | POA: Diagnosis not present

## 2021-05-31 DIAGNOSIS — E119 Type 2 diabetes mellitus without complications: Secondary | ICD-10-CM | POA: Diagnosis not present

## 2021-06-01 DIAGNOSIS — M5124 Other intervertebral disc displacement, thoracic region: Secondary | ICD-10-CM | POA: Diagnosis not present

## 2021-06-01 DIAGNOSIS — M5413 Radiculopathy, cervicothoracic region: Secondary | ICD-10-CM | POA: Diagnosis not present

## 2021-06-01 DIAGNOSIS — M50323 Other cervical disc degeneration at C6-C7 level: Secondary | ICD-10-CM | POA: Diagnosis not present

## 2021-06-01 DIAGNOSIS — M9901 Segmental and somatic dysfunction of cervical region: Secondary | ICD-10-CM | POA: Diagnosis not present

## 2021-06-01 DIAGNOSIS — M9902 Segmental and somatic dysfunction of thoracic region: Secondary | ICD-10-CM | POA: Diagnosis not present

## 2021-06-01 DIAGNOSIS — M62830 Muscle spasm of back: Secondary | ICD-10-CM | POA: Diagnosis not present

## 2021-06-01 DIAGNOSIS — M546 Pain in thoracic spine: Secondary | ICD-10-CM | POA: Diagnosis not present

## 2021-06-01 DIAGNOSIS — Z20822 Contact with and (suspected) exposure to covid-19: Secondary | ICD-10-CM | POA: Diagnosis not present

## 2021-06-08 DIAGNOSIS — Z20822 Contact with and (suspected) exposure to covid-19: Secondary | ICD-10-CM | POA: Diagnosis not present

## 2021-06-14 DIAGNOSIS — H2512 Age-related nuclear cataract, left eye: Secondary | ICD-10-CM | POA: Diagnosis not present

## 2021-06-14 DIAGNOSIS — E119 Type 2 diabetes mellitus without complications: Secondary | ICD-10-CM | POA: Diagnosis not present

## 2021-06-14 DIAGNOSIS — H25813 Combined forms of age-related cataract, bilateral: Secondary | ICD-10-CM | POA: Diagnosis not present

## 2021-06-14 DIAGNOSIS — H524 Presbyopia: Secondary | ICD-10-CM | POA: Diagnosis not present

## 2021-06-14 DIAGNOSIS — H52222 Regular astigmatism, left eye: Secondary | ICD-10-CM | POA: Diagnosis not present

## 2021-06-14 DIAGNOSIS — H25812 Combined forms of age-related cataract, left eye: Secondary | ICD-10-CM | POA: Diagnosis not present

## 2021-06-14 DIAGNOSIS — Z01818 Encounter for other preprocedural examination: Secondary | ICD-10-CM | POA: Diagnosis not present

## 2021-06-14 DIAGNOSIS — H52221 Regular astigmatism, right eye: Secondary | ICD-10-CM | POA: Diagnosis not present

## 2021-06-20 DIAGNOSIS — I10 Essential (primary) hypertension: Secondary | ICD-10-CM | POA: Diagnosis not present

## 2021-06-20 DIAGNOSIS — M170 Bilateral primary osteoarthritis of knee: Secondary | ICD-10-CM | POA: Diagnosis not present

## 2021-06-20 DIAGNOSIS — E7800 Pure hypercholesterolemia, unspecified: Secondary | ICD-10-CM | POA: Diagnosis not present

## 2021-06-20 DIAGNOSIS — M5416 Radiculopathy, lumbar region: Secondary | ICD-10-CM | POA: Diagnosis not present

## 2021-06-20 DIAGNOSIS — E118 Type 2 diabetes mellitus with unspecified complications: Secondary | ICD-10-CM | POA: Diagnosis not present

## 2021-06-20 DIAGNOSIS — Z125 Encounter for screening for malignant neoplasm of prostate: Secondary | ICD-10-CM | POA: Diagnosis not present

## 2021-06-20 DIAGNOSIS — Z79899 Other long term (current) drug therapy: Secondary | ICD-10-CM | POA: Diagnosis not present

## 2021-06-20 DIAGNOSIS — N400 Enlarged prostate without lower urinary tract symptoms: Secondary | ICD-10-CM | POA: Diagnosis not present

## 2021-07-06 DIAGNOSIS — M5124 Other intervertebral disc displacement, thoracic region: Secondary | ICD-10-CM | POA: Diagnosis not present

## 2021-07-06 DIAGNOSIS — M5413 Radiculopathy, cervicothoracic region: Secondary | ICD-10-CM | POA: Diagnosis not present

## 2021-07-06 DIAGNOSIS — M62830 Muscle spasm of back: Secondary | ICD-10-CM | POA: Diagnosis not present

## 2021-07-06 DIAGNOSIS — M9901 Segmental and somatic dysfunction of cervical region: Secondary | ICD-10-CM | POA: Diagnosis not present

## 2021-07-06 DIAGNOSIS — M9902 Segmental and somatic dysfunction of thoracic region: Secondary | ICD-10-CM | POA: Diagnosis not present

## 2021-07-06 DIAGNOSIS — M50323 Other cervical disc degeneration at C6-C7 level: Secondary | ICD-10-CM | POA: Diagnosis not present

## 2021-07-06 DIAGNOSIS — M546 Pain in thoracic spine: Secondary | ICD-10-CM | POA: Diagnosis not present

## 2021-08-04 DIAGNOSIS — L821 Other seborrheic keratosis: Secondary | ICD-10-CM | POA: Diagnosis not present

## 2021-08-04 DIAGNOSIS — L814 Other melanin hyperpigmentation: Secondary | ICD-10-CM | POA: Diagnosis not present

## 2021-08-04 DIAGNOSIS — C44519 Basal cell carcinoma of skin of other part of trunk: Secondary | ICD-10-CM | POA: Diagnosis not present

## 2021-08-04 DIAGNOSIS — L578 Other skin changes due to chronic exposure to nonionizing radiation: Secondary | ICD-10-CM | POA: Diagnosis not present

## 2021-08-04 DIAGNOSIS — L918 Other hypertrophic disorders of the skin: Secondary | ICD-10-CM | POA: Diagnosis not present

## 2021-08-16 DIAGNOSIS — M546 Pain in thoracic spine: Secondary | ICD-10-CM | POA: Diagnosis not present

## 2021-08-16 DIAGNOSIS — M5124 Other intervertebral disc displacement, thoracic region: Secondary | ICD-10-CM | POA: Diagnosis not present

## 2021-08-16 DIAGNOSIS — M62830 Muscle spasm of back: Secondary | ICD-10-CM | POA: Diagnosis not present

## 2021-08-16 DIAGNOSIS — M9901 Segmental and somatic dysfunction of cervical region: Secondary | ICD-10-CM | POA: Diagnosis not present

## 2021-08-16 DIAGNOSIS — M9902 Segmental and somatic dysfunction of thoracic region: Secondary | ICD-10-CM | POA: Diagnosis not present

## 2021-08-16 DIAGNOSIS — M50323 Other cervical disc degeneration at C6-C7 level: Secondary | ICD-10-CM | POA: Diagnosis not present

## 2021-08-16 DIAGNOSIS — M5413 Radiculopathy, cervicothoracic region: Secondary | ICD-10-CM | POA: Diagnosis not present

## 2021-09-21 DIAGNOSIS — M170 Bilateral primary osteoarthritis of knee: Secondary | ICD-10-CM | POA: Diagnosis not present

## 2021-09-21 DIAGNOSIS — Z9181 History of falling: Secondary | ICD-10-CM | POA: Diagnosis not present

## 2021-09-21 DIAGNOSIS — Z1331 Encounter for screening for depression: Secondary | ICD-10-CM | POA: Diagnosis not present

## 2021-09-21 DIAGNOSIS — E1169 Type 2 diabetes mellitus with other specified complication: Secondary | ICD-10-CM | POA: Diagnosis not present

## 2021-09-21 DIAGNOSIS — I10 Essential (primary) hypertension: Secondary | ICD-10-CM | POA: Diagnosis not present

## 2021-09-21 DIAGNOSIS — E7800 Pure hypercholesterolemia, unspecified: Secondary | ICD-10-CM | POA: Diagnosis not present

## 2021-09-21 DIAGNOSIS — M5416 Radiculopathy, lumbar region: Secondary | ICD-10-CM | POA: Diagnosis not present

## 2021-09-21 DIAGNOSIS — N400 Enlarged prostate without lower urinary tract symptoms: Secondary | ICD-10-CM | POA: Diagnosis not present

## 2021-10-05 DIAGNOSIS — M50323 Other cervical disc degeneration at C6-C7 level: Secondary | ICD-10-CM | POA: Diagnosis not present

## 2021-10-05 DIAGNOSIS — M9901 Segmental and somatic dysfunction of cervical region: Secondary | ICD-10-CM | POA: Diagnosis not present

## 2021-10-05 DIAGNOSIS — M546 Pain in thoracic spine: Secondary | ICD-10-CM | POA: Diagnosis not present

## 2021-10-05 DIAGNOSIS — M9902 Segmental and somatic dysfunction of thoracic region: Secondary | ICD-10-CM | POA: Diagnosis not present

## 2021-10-05 DIAGNOSIS — M62830 Muscle spasm of back: Secondary | ICD-10-CM | POA: Diagnosis not present

## 2021-10-05 DIAGNOSIS — M5124 Other intervertebral disc displacement, thoracic region: Secondary | ICD-10-CM | POA: Diagnosis not present

## 2021-10-05 DIAGNOSIS — M5413 Radiculopathy, cervicothoracic region: Secondary | ICD-10-CM | POA: Diagnosis not present

## 2021-11-09 DIAGNOSIS — M50323 Other cervical disc degeneration at C6-C7 level: Secondary | ICD-10-CM | POA: Diagnosis not present

## 2021-11-09 DIAGNOSIS — M9902 Segmental and somatic dysfunction of thoracic region: Secondary | ICD-10-CM | POA: Diagnosis not present

## 2021-11-09 DIAGNOSIS — M546 Pain in thoracic spine: Secondary | ICD-10-CM | POA: Diagnosis not present

## 2021-11-09 DIAGNOSIS — M9901 Segmental and somatic dysfunction of cervical region: Secondary | ICD-10-CM | POA: Diagnosis not present

## 2021-11-09 DIAGNOSIS — M62830 Muscle spasm of back: Secondary | ICD-10-CM | POA: Diagnosis not present

## 2021-11-09 DIAGNOSIS — M5413 Radiculopathy, cervicothoracic region: Secondary | ICD-10-CM | POA: Diagnosis not present

## 2021-11-09 DIAGNOSIS — M5124 Other intervertebral disc displacement, thoracic region: Secondary | ICD-10-CM | POA: Diagnosis not present

## 2021-12-07 DIAGNOSIS — M9902 Segmental and somatic dysfunction of thoracic region: Secondary | ICD-10-CM | POA: Diagnosis not present

## 2021-12-07 DIAGNOSIS — M546 Pain in thoracic spine: Secondary | ICD-10-CM | POA: Diagnosis not present

## 2021-12-07 DIAGNOSIS — M9901 Segmental and somatic dysfunction of cervical region: Secondary | ICD-10-CM | POA: Diagnosis not present

## 2021-12-07 DIAGNOSIS — M5124 Other intervertebral disc displacement, thoracic region: Secondary | ICD-10-CM | POA: Diagnosis not present

## 2021-12-07 DIAGNOSIS — M50323 Other cervical disc degeneration at C6-C7 level: Secondary | ICD-10-CM | POA: Diagnosis not present

## 2021-12-07 DIAGNOSIS — M62830 Muscle spasm of back: Secondary | ICD-10-CM | POA: Diagnosis not present

## 2021-12-07 DIAGNOSIS — M5413 Radiculopathy, cervicothoracic region: Secondary | ICD-10-CM | POA: Diagnosis not present

## 2022-01-09 DIAGNOSIS — J309 Allergic rhinitis, unspecified: Secondary | ICD-10-CM | POA: Diagnosis not present

## 2022-01-09 DIAGNOSIS — M5416 Radiculopathy, lumbar region: Secondary | ICD-10-CM | POA: Diagnosis not present

## 2022-01-09 DIAGNOSIS — G4733 Obstructive sleep apnea (adult) (pediatric): Secondary | ICD-10-CM | POA: Diagnosis not present

## 2022-01-09 DIAGNOSIS — M170 Bilateral primary osteoarthritis of knee: Secondary | ICD-10-CM | POA: Diagnosis not present

## 2022-01-09 DIAGNOSIS — N400 Enlarged prostate without lower urinary tract symptoms: Secondary | ICD-10-CM | POA: Diagnosis not present

## 2022-01-09 DIAGNOSIS — Z139 Encounter for screening, unspecified: Secondary | ICD-10-CM | POA: Diagnosis not present

## 2022-01-09 DIAGNOSIS — E7800 Pure hypercholesterolemia, unspecified: Secondary | ICD-10-CM | POA: Diagnosis not present

## 2022-01-09 DIAGNOSIS — E1169 Type 2 diabetes mellitus with other specified complication: Secondary | ICD-10-CM | POA: Diagnosis not present

## 2022-01-09 DIAGNOSIS — I10 Essential (primary) hypertension: Secondary | ICD-10-CM | POA: Diagnosis not present

## 2022-01-11 DIAGNOSIS — M546 Pain in thoracic spine: Secondary | ICD-10-CM | POA: Diagnosis not present

## 2022-01-11 DIAGNOSIS — M50323 Other cervical disc degeneration at C6-C7 level: Secondary | ICD-10-CM | POA: Diagnosis not present

## 2022-01-11 DIAGNOSIS — M5413 Radiculopathy, cervicothoracic region: Secondary | ICD-10-CM | POA: Diagnosis not present

## 2022-01-11 DIAGNOSIS — M5124 Other intervertebral disc displacement, thoracic region: Secondary | ICD-10-CM | POA: Diagnosis not present

## 2022-01-11 DIAGNOSIS — M9902 Segmental and somatic dysfunction of thoracic region: Secondary | ICD-10-CM | POA: Diagnosis not present

## 2022-01-11 DIAGNOSIS — M9901 Segmental and somatic dysfunction of cervical region: Secondary | ICD-10-CM | POA: Diagnosis not present

## 2022-01-11 DIAGNOSIS — M62830 Muscle spasm of back: Secondary | ICD-10-CM | POA: Diagnosis not present

## 2022-02-14 DIAGNOSIS — M546 Pain in thoracic spine: Secondary | ICD-10-CM | POA: Diagnosis not present

## 2022-02-14 DIAGNOSIS — M5124 Other intervertebral disc displacement, thoracic region: Secondary | ICD-10-CM | POA: Diagnosis not present

## 2022-02-14 DIAGNOSIS — M62830 Muscle spasm of back: Secondary | ICD-10-CM | POA: Diagnosis not present

## 2022-02-14 DIAGNOSIS — M9902 Segmental and somatic dysfunction of thoracic region: Secondary | ICD-10-CM | POA: Diagnosis not present

## 2022-02-14 DIAGNOSIS — M50323 Other cervical disc degeneration at C6-C7 level: Secondary | ICD-10-CM | POA: Diagnosis not present

## 2022-02-14 DIAGNOSIS — M5413 Radiculopathy, cervicothoracic region: Secondary | ICD-10-CM | POA: Diagnosis not present

## 2022-02-14 DIAGNOSIS — M9901 Segmental and somatic dysfunction of cervical region: Secondary | ICD-10-CM | POA: Diagnosis not present

## 2022-03-15 DIAGNOSIS — M5124 Other intervertebral disc displacement, thoracic region: Secondary | ICD-10-CM | POA: Diagnosis not present

## 2022-03-15 DIAGNOSIS — M62830 Muscle spasm of back: Secondary | ICD-10-CM | POA: Diagnosis not present

## 2022-03-15 DIAGNOSIS — M5413 Radiculopathy, cervicothoracic region: Secondary | ICD-10-CM | POA: Diagnosis not present

## 2022-03-15 DIAGNOSIS — M9902 Segmental and somatic dysfunction of thoracic region: Secondary | ICD-10-CM | POA: Diagnosis not present

## 2022-03-15 DIAGNOSIS — M50323 Other cervical disc degeneration at C6-C7 level: Secondary | ICD-10-CM | POA: Diagnosis not present

## 2022-03-15 DIAGNOSIS — M546 Pain in thoracic spine: Secondary | ICD-10-CM | POA: Diagnosis not present

## 2022-03-15 DIAGNOSIS — M9901 Segmental and somatic dysfunction of cervical region: Secondary | ICD-10-CM | POA: Diagnosis not present

## 2022-04-09 DIAGNOSIS — R519 Headache, unspecified: Secondary | ICD-10-CM | POA: Diagnosis not present

## 2022-04-09 DIAGNOSIS — R051 Acute cough: Secondary | ICD-10-CM | POA: Diagnosis not present

## 2022-04-09 DIAGNOSIS — J0100 Acute maxillary sinusitis, unspecified: Secondary | ICD-10-CM | POA: Diagnosis not present

## 2022-04-09 DIAGNOSIS — R0981 Nasal congestion: Secondary | ICD-10-CM | POA: Diagnosis not present

## 2022-04-12 DIAGNOSIS — E7800 Pure hypercholesterolemia, unspecified: Secondary | ICD-10-CM | POA: Diagnosis not present

## 2022-04-12 DIAGNOSIS — M5416 Radiculopathy, lumbar region: Secondary | ICD-10-CM | POA: Diagnosis not present

## 2022-04-12 DIAGNOSIS — Z1331 Encounter for screening for depression: Secondary | ICD-10-CM | POA: Diagnosis not present

## 2022-04-12 DIAGNOSIS — E1169 Type 2 diabetes mellitus with other specified complication: Secondary | ICD-10-CM | POA: Diagnosis not present

## 2022-04-12 DIAGNOSIS — Z139 Encounter for screening, unspecified: Secondary | ICD-10-CM | POA: Diagnosis not present

## 2022-04-12 DIAGNOSIS — G4733 Obstructive sleep apnea (adult) (pediatric): Secondary | ICD-10-CM | POA: Diagnosis not present

## 2022-04-12 DIAGNOSIS — J309 Allergic rhinitis, unspecified: Secondary | ICD-10-CM | POA: Diagnosis not present

## 2022-04-12 DIAGNOSIS — M170 Bilateral primary osteoarthritis of knee: Secondary | ICD-10-CM | POA: Diagnosis not present

## 2022-04-12 DIAGNOSIS — J019 Acute sinusitis, unspecified: Secondary | ICD-10-CM | POA: Diagnosis not present

## 2022-04-12 DIAGNOSIS — N400 Enlarged prostate without lower urinary tract symptoms: Secondary | ICD-10-CM | POA: Diagnosis not present

## 2022-04-12 DIAGNOSIS — I10 Essential (primary) hypertension: Secondary | ICD-10-CM | POA: Diagnosis not present

## 2022-04-25 DIAGNOSIS — M9901 Segmental and somatic dysfunction of cervical region: Secondary | ICD-10-CM | POA: Diagnosis not present

## 2022-04-25 DIAGNOSIS — M50323 Other cervical disc degeneration at C6-C7 level: Secondary | ICD-10-CM | POA: Diagnosis not present

## 2022-04-25 DIAGNOSIS — M62830 Muscle spasm of back: Secondary | ICD-10-CM | POA: Diagnosis not present

## 2022-04-25 DIAGNOSIS — M5124 Other intervertebral disc displacement, thoracic region: Secondary | ICD-10-CM | POA: Diagnosis not present

## 2022-04-25 DIAGNOSIS — M546 Pain in thoracic spine: Secondary | ICD-10-CM | POA: Diagnosis not present

## 2022-04-25 DIAGNOSIS — M5413 Radiculopathy, cervicothoracic region: Secondary | ICD-10-CM | POA: Diagnosis not present

## 2022-04-25 DIAGNOSIS — M9902 Segmental and somatic dysfunction of thoracic region: Secondary | ICD-10-CM | POA: Diagnosis not present

## 2022-05-15 DIAGNOSIS — S62525B Nondisplaced fracture of distal phalanx of left thumb, initial encounter for open fracture: Secondary | ICD-10-CM | POA: Diagnosis not present

## 2022-05-15 DIAGNOSIS — S61012A Laceration without foreign body of left thumb without damage to nail, initial encounter: Secondary | ICD-10-CM | POA: Diagnosis not present

## 2022-05-17 DIAGNOSIS — S62522B Displaced fracture of distal phalanx of left thumb, initial encounter for open fracture: Secondary | ICD-10-CM | POA: Diagnosis not present

## 2022-05-17 DIAGNOSIS — S61012A Laceration without foreign body of left thumb without damage to nail, initial encounter: Secondary | ICD-10-CM | POA: Diagnosis not present

## 2022-05-18 ENCOUNTER — Telehealth: Payer: Self-pay

## 2022-05-18 NOTE — Telephone Encounter (Signed)
     Patient  visit on 05/15/2022  at [facility] was for treatment.  Have you been able to follow up with your primary care physician? Patient has seen Orthopedic Surgeon.  The patient was or was not able to obtain any needed medicine or equipment. Patient was able to obtain medication.  Are there diet recommendations that you are having difficulty following? No  Patient expresses understanding of discharge instructions and education provided has no other needs at this time. Yes   Ethan Greene [facility]  [facility] Population Health Community Resource Care Guide   [email or web address]  ?? [phone]   Website: [email or web address]  [email or web address]

## 2022-05-21 DIAGNOSIS — S66022A Laceration of long flexor muscle, fascia and tendon of left thumb at wrist and hand level, initial encounter: Secondary | ICD-10-CM | POA: Diagnosis not present

## 2022-05-21 DIAGNOSIS — G8918 Other acute postprocedural pain: Secondary | ICD-10-CM | POA: Diagnosis not present

## 2022-05-21 DIAGNOSIS — Y999 Unspecified external cause status: Secondary | ICD-10-CM | POA: Diagnosis not present

## 2022-05-21 DIAGNOSIS — S62522B Displaced fracture of distal phalanx of left thumb, initial encounter for open fracture: Secondary | ICD-10-CM | POA: Diagnosis not present

## 2022-05-21 DIAGNOSIS — W312XXA Contact with powered woodworking and forming machines, initial encounter: Secondary | ICD-10-CM | POA: Diagnosis not present

## 2022-05-29 DIAGNOSIS — S61012A Laceration without foreign body of left thumb without damage to nail, initial encounter: Secondary | ICD-10-CM | POA: Diagnosis not present

## 2022-05-29 DIAGNOSIS — S62522B Displaced fracture of distal phalanx of left thumb, initial encounter for open fracture: Secondary | ICD-10-CM | POA: Diagnosis not present

## 2022-06-05 DIAGNOSIS — S62522B Displaced fracture of distal phalanx of left thumb, initial encounter for open fracture: Secondary | ICD-10-CM | POA: Diagnosis not present

## 2022-06-05 DIAGNOSIS — S61012A Laceration without foreign body of left thumb without damage to nail, initial encounter: Secondary | ICD-10-CM | POA: Diagnosis not present

## 2022-06-19 DIAGNOSIS — S62522B Displaced fracture of distal phalanx of left thumb, initial encounter for open fracture: Secondary | ICD-10-CM | POA: Diagnosis not present

## 2022-06-19 DIAGNOSIS — S61012A Laceration without foreign body of left thumb without damage to nail, initial encounter: Secondary | ICD-10-CM | POA: Diagnosis not present

## 2022-06-28 DIAGNOSIS — M50323 Other cervical disc degeneration at C6-C7 level: Secondary | ICD-10-CM | POA: Diagnosis not present

## 2022-06-28 DIAGNOSIS — M9901 Segmental and somatic dysfunction of cervical region: Secondary | ICD-10-CM | POA: Diagnosis not present

## 2022-06-28 DIAGNOSIS — M546 Pain in thoracic spine: Secondary | ICD-10-CM | POA: Diagnosis not present

## 2022-06-28 DIAGNOSIS — M62830 Muscle spasm of back: Secondary | ICD-10-CM | POA: Diagnosis not present

## 2022-06-28 DIAGNOSIS — M5124 Other intervertebral disc displacement, thoracic region: Secondary | ICD-10-CM | POA: Diagnosis not present

## 2022-06-28 DIAGNOSIS — M9902 Segmental and somatic dysfunction of thoracic region: Secondary | ICD-10-CM | POA: Diagnosis not present

## 2022-06-28 DIAGNOSIS — M5413 Radiculopathy, cervicothoracic region: Secondary | ICD-10-CM | POA: Diagnosis not present

## 2022-07-05 DIAGNOSIS — M79645 Pain in left finger(s): Secondary | ICD-10-CM | POA: Diagnosis not present

## 2022-07-10 DIAGNOSIS — S62522B Displaced fracture of distal phalanx of left thumb, initial encounter for open fracture: Secondary | ICD-10-CM | POA: Diagnosis not present

## 2022-07-10 DIAGNOSIS — S61012A Laceration without foreign body of left thumb without damage to nail, initial encounter: Secondary | ICD-10-CM | POA: Diagnosis not present

## 2022-07-12 DIAGNOSIS — M79645 Pain in left finger(s): Secondary | ICD-10-CM | POA: Diagnosis not present

## 2022-07-17 DIAGNOSIS — M79645 Pain in left finger(s): Secondary | ICD-10-CM | POA: Diagnosis not present

## 2022-07-19 DIAGNOSIS — Z79899 Other long term (current) drug therapy: Secondary | ICD-10-CM | POA: Diagnosis not present

## 2022-07-19 DIAGNOSIS — M170 Bilateral primary osteoarthritis of knee: Secondary | ICD-10-CM | POA: Diagnosis not present

## 2022-07-19 DIAGNOSIS — Z125 Encounter for screening for malignant neoplasm of prostate: Secondary | ICD-10-CM | POA: Diagnosis not present

## 2022-07-19 DIAGNOSIS — N400 Enlarged prostate without lower urinary tract symptoms: Secondary | ICD-10-CM | POA: Diagnosis not present

## 2022-07-19 DIAGNOSIS — M79645 Pain in left finger(s): Secondary | ICD-10-CM | POA: Diagnosis not present

## 2022-07-19 DIAGNOSIS — I10 Essential (primary) hypertension: Secondary | ICD-10-CM | POA: Diagnosis not present

## 2022-07-19 DIAGNOSIS — J309 Allergic rhinitis, unspecified: Secondary | ICD-10-CM | POA: Diagnosis not present

## 2022-07-19 DIAGNOSIS — E7800 Pure hypercholesterolemia, unspecified: Secondary | ICD-10-CM | POA: Diagnosis not present

## 2022-07-19 DIAGNOSIS — M5416 Radiculopathy, lumbar region: Secondary | ICD-10-CM | POA: Diagnosis not present

## 2022-07-19 DIAGNOSIS — R413 Other amnesia: Secondary | ICD-10-CM | POA: Diagnosis not present

## 2022-07-19 DIAGNOSIS — G4733 Obstructive sleep apnea (adult) (pediatric): Secondary | ICD-10-CM | POA: Diagnosis not present

## 2022-07-19 DIAGNOSIS — E1169 Type 2 diabetes mellitus with other specified complication: Secondary | ICD-10-CM | POA: Diagnosis not present

## 2022-07-19 DIAGNOSIS — I951 Orthostatic hypotension: Secondary | ICD-10-CM | POA: Diagnosis not present

## 2022-07-20 DIAGNOSIS — H26492 Other secondary cataract, left eye: Secondary | ICD-10-CM | POA: Diagnosis not present

## 2022-07-20 DIAGNOSIS — E119 Type 2 diabetes mellitus without complications: Secondary | ICD-10-CM | POA: Diagnosis not present

## 2022-07-20 DIAGNOSIS — Z7984 Long term (current) use of oral hypoglycemic drugs: Secondary | ICD-10-CM | POA: Diagnosis not present

## 2022-07-20 DIAGNOSIS — H2511 Age-related nuclear cataract, right eye: Secondary | ICD-10-CM | POA: Diagnosis not present

## 2022-07-24 DIAGNOSIS — M79645 Pain in left finger(s): Secondary | ICD-10-CM | POA: Diagnosis not present

## 2022-07-25 DIAGNOSIS — M9902 Segmental and somatic dysfunction of thoracic region: Secondary | ICD-10-CM | POA: Diagnosis not present

## 2022-07-25 DIAGNOSIS — M5413 Radiculopathy, cervicothoracic region: Secondary | ICD-10-CM | POA: Diagnosis not present

## 2022-07-25 DIAGNOSIS — M546 Pain in thoracic spine: Secondary | ICD-10-CM | POA: Diagnosis not present

## 2022-07-25 DIAGNOSIS — M62830 Muscle spasm of back: Secondary | ICD-10-CM | POA: Diagnosis not present

## 2022-07-25 DIAGNOSIS — M50323 Other cervical disc degeneration at C6-C7 level: Secondary | ICD-10-CM | POA: Diagnosis not present

## 2022-07-25 DIAGNOSIS — M9901 Segmental and somatic dysfunction of cervical region: Secondary | ICD-10-CM | POA: Diagnosis not present

## 2022-07-25 DIAGNOSIS — M5124 Other intervertebral disc displacement, thoracic region: Secondary | ICD-10-CM | POA: Diagnosis not present

## 2022-07-26 DIAGNOSIS — M79645 Pain in left finger(s): Secondary | ICD-10-CM | POA: Diagnosis not present

## 2022-08-08 DIAGNOSIS — M79645 Pain in left finger(s): Secondary | ICD-10-CM | POA: Diagnosis not present

## 2022-08-09 DIAGNOSIS — S62522B Displaced fracture of distal phalanx of left thumb, initial encounter for open fracture: Secondary | ICD-10-CM | POA: Diagnosis not present

## 2022-08-09 DIAGNOSIS — S61012A Laceration without foreign body of left thumb without damage to nail, initial encounter: Secondary | ICD-10-CM | POA: Diagnosis not present

## 2022-08-14 DIAGNOSIS — M79645 Pain in left finger(s): Secondary | ICD-10-CM | POA: Diagnosis not present

## 2022-08-16 DIAGNOSIS — M79645 Pain in left finger(s): Secondary | ICD-10-CM | POA: Diagnosis not present

## 2022-08-21 DIAGNOSIS — M79645 Pain in left finger(s): Secondary | ICD-10-CM | POA: Diagnosis not present

## 2022-08-23 DIAGNOSIS — M9901 Segmental and somatic dysfunction of cervical region: Secondary | ICD-10-CM | POA: Diagnosis not present

## 2022-08-23 DIAGNOSIS — M79645 Pain in left finger(s): Secondary | ICD-10-CM | POA: Diagnosis not present

## 2022-08-23 DIAGNOSIS — M546 Pain in thoracic spine: Secondary | ICD-10-CM | POA: Diagnosis not present

## 2022-08-23 DIAGNOSIS — M5124 Other intervertebral disc displacement, thoracic region: Secondary | ICD-10-CM | POA: Diagnosis not present

## 2022-08-23 DIAGNOSIS — M9902 Segmental and somatic dysfunction of thoracic region: Secondary | ICD-10-CM | POA: Diagnosis not present

## 2022-08-23 DIAGNOSIS — M5413 Radiculopathy, cervicothoracic region: Secondary | ICD-10-CM | POA: Diagnosis not present

## 2022-08-23 DIAGNOSIS — M50323 Other cervical disc degeneration at C6-C7 level: Secondary | ICD-10-CM | POA: Diagnosis not present

## 2022-08-23 DIAGNOSIS — M62830 Muscle spasm of back: Secondary | ICD-10-CM | POA: Diagnosis not present

## 2022-08-28 DIAGNOSIS — M79645 Pain in left finger(s): Secondary | ICD-10-CM | POA: Diagnosis not present

## 2022-08-30 DIAGNOSIS — M79645 Pain in left finger(s): Secondary | ICD-10-CM | POA: Diagnosis not present

## 2022-09-03 DIAGNOSIS — M79645 Pain in left finger(s): Secondary | ICD-10-CM | POA: Diagnosis not present

## 2022-09-06 DIAGNOSIS — M79645 Pain in left finger(s): Secondary | ICD-10-CM | POA: Diagnosis not present

## 2022-09-10 DIAGNOSIS — M79645 Pain in left finger(s): Secondary | ICD-10-CM | POA: Diagnosis not present

## 2022-09-11 DIAGNOSIS — S62522B Displaced fracture of distal phalanx of left thumb, initial encounter for open fracture: Secondary | ICD-10-CM | POA: Diagnosis not present

## 2022-09-27 DIAGNOSIS — M62830 Muscle spasm of back: Secondary | ICD-10-CM | POA: Diagnosis not present

## 2022-09-27 DIAGNOSIS — M9901 Segmental and somatic dysfunction of cervical region: Secondary | ICD-10-CM | POA: Diagnosis not present

## 2022-09-27 DIAGNOSIS — M5413 Radiculopathy, cervicothoracic region: Secondary | ICD-10-CM | POA: Diagnosis not present

## 2022-09-27 DIAGNOSIS — M5124 Other intervertebral disc displacement, thoracic region: Secondary | ICD-10-CM | POA: Diagnosis not present

## 2022-09-27 DIAGNOSIS — M9902 Segmental and somatic dysfunction of thoracic region: Secondary | ICD-10-CM | POA: Diagnosis not present

## 2022-09-27 DIAGNOSIS — M50323 Other cervical disc degeneration at C6-C7 level: Secondary | ICD-10-CM | POA: Diagnosis not present

## 2022-09-27 DIAGNOSIS — M546 Pain in thoracic spine: Secondary | ICD-10-CM | POA: Diagnosis not present

## 2022-10-22 DIAGNOSIS — J309 Allergic rhinitis, unspecified: Secondary | ICD-10-CM | POA: Diagnosis not present

## 2022-10-22 DIAGNOSIS — N400 Enlarged prostate without lower urinary tract symptoms: Secondary | ICD-10-CM | POA: Diagnosis not present

## 2022-10-22 DIAGNOSIS — G4733 Obstructive sleep apnea (adult) (pediatric): Secondary | ICD-10-CM | POA: Diagnosis not present

## 2022-10-22 DIAGNOSIS — E1169 Type 2 diabetes mellitus with other specified complication: Secondary | ICD-10-CM | POA: Diagnosis not present

## 2022-10-22 DIAGNOSIS — R600 Localized edema: Secondary | ICD-10-CM | POA: Diagnosis not present

## 2022-10-22 DIAGNOSIS — I10 Essential (primary) hypertension: Secondary | ICD-10-CM | POA: Diagnosis not present

## 2022-10-22 DIAGNOSIS — M5416 Radiculopathy, lumbar region: Secondary | ICD-10-CM | POA: Diagnosis not present

## 2022-10-22 DIAGNOSIS — M170 Bilateral primary osteoarthritis of knee: Secondary | ICD-10-CM | POA: Diagnosis not present

## 2022-10-22 DIAGNOSIS — E7800 Pure hypercholesterolemia, unspecified: Secondary | ICD-10-CM | POA: Diagnosis not present

## 2022-10-25 DIAGNOSIS — M9902 Segmental and somatic dysfunction of thoracic region: Secondary | ICD-10-CM | POA: Diagnosis not present

## 2022-10-25 DIAGNOSIS — M62830 Muscle spasm of back: Secondary | ICD-10-CM | POA: Diagnosis not present

## 2022-10-25 DIAGNOSIS — M546 Pain in thoracic spine: Secondary | ICD-10-CM | POA: Diagnosis not present

## 2022-10-25 DIAGNOSIS — M5413 Radiculopathy, cervicothoracic region: Secondary | ICD-10-CM | POA: Diagnosis not present

## 2022-10-25 DIAGNOSIS — M5124 Other intervertebral disc displacement, thoracic region: Secondary | ICD-10-CM | POA: Diagnosis not present

## 2022-10-25 DIAGNOSIS — M50323 Other cervical disc degeneration at C6-C7 level: Secondary | ICD-10-CM | POA: Diagnosis not present

## 2022-10-25 DIAGNOSIS — M9901 Segmental and somatic dysfunction of cervical region: Secondary | ICD-10-CM | POA: Diagnosis not present

## 2022-11-13 DIAGNOSIS — S62522B Displaced fracture of distal phalanx of left thumb, initial encounter for open fracture: Secondary | ICD-10-CM | POA: Diagnosis not present

## 2022-11-28 DIAGNOSIS — M5124 Other intervertebral disc displacement, thoracic region: Secondary | ICD-10-CM | POA: Diagnosis not present

## 2022-11-28 DIAGNOSIS — M9901 Segmental and somatic dysfunction of cervical region: Secondary | ICD-10-CM | POA: Diagnosis not present

## 2022-11-28 DIAGNOSIS — M9902 Segmental and somatic dysfunction of thoracic region: Secondary | ICD-10-CM | POA: Diagnosis not present

## 2022-11-28 DIAGNOSIS — M50323 Other cervical disc degeneration at C6-C7 level: Secondary | ICD-10-CM | POA: Diagnosis not present

## 2022-11-28 DIAGNOSIS — M62830 Muscle spasm of back: Secondary | ICD-10-CM | POA: Diagnosis not present

## 2022-11-28 DIAGNOSIS — M546 Pain in thoracic spine: Secondary | ICD-10-CM | POA: Diagnosis not present

## 2022-11-28 DIAGNOSIS — M5413 Radiculopathy, cervicothoracic region: Secondary | ICD-10-CM | POA: Diagnosis not present

## 2022-12-05 DIAGNOSIS — Z23 Encounter for immunization: Secondary | ICD-10-CM | POA: Diagnosis not present

## 2023-01-10 DIAGNOSIS — M62830 Muscle spasm of back: Secondary | ICD-10-CM | POA: Diagnosis not present

## 2023-01-10 DIAGNOSIS — M5413 Radiculopathy, cervicothoracic region: Secondary | ICD-10-CM | POA: Diagnosis not present

## 2023-01-10 DIAGNOSIS — M5124 Other intervertebral disc displacement, thoracic region: Secondary | ICD-10-CM | POA: Diagnosis not present

## 2023-01-10 DIAGNOSIS — M9901 Segmental and somatic dysfunction of cervical region: Secondary | ICD-10-CM | POA: Diagnosis not present

## 2023-01-10 DIAGNOSIS — M50323 Other cervical disc degeneration at C6-C7 level: Secondary | ICD-10-CM | POA: Diagnosis not present

## 2023-01-10 DIAGNOSIS — M546 Pain in thoracic spine: Secondary | ICD-10-CM | POA: Diagnosis not present

## 2023-01-10 DIAGNOSIS — M9902 Segmental and somatic dysfunction of thoracic region: Secondary | ICD-10-CM | POA: Diagnosis not present

## 2023-01-17 DIAGNOSIS — E7800 Pure hypercholesterolemia, unspecified: Secondary | ICD-10-CM | POA: Diagnosis not present

## 2023-01-17 DIAGNOSIS — Z1159 Encounter for screening for other viral diseases: Secondary | ICD-10-CM | POA: Diagnosis not present

## 2023-01-17 DIAGNOSIS — E1169 Type 2 diabetes mellitus with other specified complication: Secondary | ICD-10-CM | POA: Diagnosis not present

## 2023-01-17 DIAGNOSIS — I10 Essential (primary) hypertension: Secondary | ICD-10-CM | POA: Diagnosis not present

## 2023-01-21 DIAGNOSIS — I10 Essential (primary) hypertension: Secondary | ICD-10-CM | POA: Diagnosis not present

## 2023-01-21 DIAGNOSIS — J309 Allergic rhinitis, unspecified: Secondary | ICD-10-CM | POA: Diagnosis not present

## 2023-01-21 DIAGNOSIS — Z0000 Encounter for general adult medical examination without abnormal findings: Secondary | ICD-10-CM | POA: Diagnosis not present

## 2023-01-21 DIAGNOSIS — N281 Cyst of kidney, acquired: Secondary | ICD-10-CM | POA: Diagnosis not present

## 2023-01-21 DIAGNOSIS — M170 Bilateral primary osteoarthritis of knee: Secondary | ICD-10-CM | POA: Diagnosis not present

## 2023-01-21 DIAGNOSIS — R600 Localized edema: Secondary | ICD-10-CM | POA: Diagnosis not present

## 2023-01-21 DIAGNOSIS — N400 Enlarged prostate without lower urinary tract symptoms: Secondary | ICD-10-CM | POA: Diagnosis not present

## 2023-01-21 DIAGNOSIS — Z9181 History of falling: Secondary | ICD-10-CM | POA: Diagnosis not present

## 2023-01-21 DIAGNOSIS — E1169 Type 2 diabetes mellitus with other specified complication: Secondary | ICD-10-CM | POA: Diagnosis not present

## 2023-01-21 DIAGNOSIS — M5416 Radiculopathy, lumbar region: Secondary | ICD-10-CM | POA: Diagnosis not present

## 2023-01-21 DIAGNOSIS — E7800 Pure hypercholesterolemia, unspecified: Secondary | ICD-10-CM | POA: Diagnosis not present

## 2023-01-21 DIAGNOSIS — G4733 Obstructive sleep apnea (adult) (pediatric): Secondary | ICD-10-CM | POA: Diagnosis not present

## 2023-02-05 DIAGNOSIS — M546 Pain in thoracic spine: Secondary | ICD-10-CM | POA: Diagnosis not present

## 2023-02-05 DIAGNOSIS — M50323 Other cervical disc degeneration at C6-C7 level: Secondary | ICD-10-CM | POA: Diagnosis not present

## 2023-02-05 DIAGNOSIS — M5413 Radiculopathy, cervicothoracic region: Secondary | ICD-10-CM | POA: Diagnosis not present

## 2023-02-05 DIAGNOSIS — M62830 Muscle spasm of back: Secondary | ICD-10-CM | POA: Diagnosis not present

## 2023-02-05 DIAGNOSIS — M5124 Other intervertebral disc displacement, thoracic region: Secondary | ICD-10-CM | POA: Diagnosis not present

## 2023-02-05 DIAGNOSIS — M9902 Segmental and somatic dysfunction of thoracic region: Secondary | ICD-10-CM | POA: Diagnosis not present

## 2023-02-05 DIAGNOSIS — M9901 Segmental and somatic dysfunction of cervical region: Secondary | ICD-10-CM | POA: Diagnosis not present

## 2023-03-07 DIAGNOSIS — M9902 Segmental and somatic dysfunction of thoracic region: Secondary | ICD-10-CM | POA: Diagnosis not present

## 2023-03-07 DIAGNOSIS — M9901 Segmental and somatic dysfunction of cervical region: Secondary | ICD-10-CM | POA: Diagnosis not present

## 2023-03-07 DIAGNOSIS — M50323 Other cervical disc degeneration at C6-C7 level: Secondary | ICD-10-CM | POA: Diagnosis not present

## 2023-03-07 DIAGNOSIS — M5413 Radiculopathy, cervicothoracic region: Secondary | ICD-10-CM | POA: Diagnosis not present

## 2023-03-07 DIAGNOSIS — M5124 Other intervertebral disc displacement, thoracic region: Secondary | ICD-10-CM | POA: Diagnosis not present

## 2023-03-07 DIAGNOSIS — M546 Pain in thoracic spine: Secondary | ICD-10-CM | POA: Diagnosis not present

## 2023-03-07 DIAGNOSIS — M62830 Muscle spasm of back: Secondary | ICD-10-CM | POA: Diagnosis not present

## 2023-03-13 DIAGNOSIS — R351 Nocturia: Secondary | ICD-10-CM | POA: Diagnosis not present

## 2023-03-13 DIAGNOSIS — N401 Enlarged prostate with lower urinary tract symptoms: Secondary | ICD-10-CM | POA: Diagnosis not present

## 2023-03-13 DIAGNOSIS — N281 Cyst of kidney, acquired: Secondary | ICD-10-CM | POA: Diagnosis not present

## 2023-04-03 DIAGNOSIS — N281 Cyst of kidney, acquired: Secondary | ICD-10-CM | POA: Diagnosis not present

## 2023-04-05 DIAGNOSIS — M62830 Muscle spasm of back: Secondary | ICD-10-CM | POA: Diagnosis not present

## 2023-04-05 DIAGNOSIS — M5413 Radiculopathy, cervicothoracic region: Secondary | ICD-10-CM | POA: Diagnosis not present

## 2023-04-05 DIAGNOSIS — M9902 Segmental and somatic dysfunction of thoracic region: Secondary | ICD-10-CM | POA: Diagnosis not present

## 2023-04-05 DIAGNOSIS — M5124 Other intervertebral disc displacement, thoracic region: Secondary | ICD-10-CM | POA: Diagnosis not present

## 2023-04-05 DIAGNOSIS — M546 Pain in thoracic spine: Secondary | ICD-10-CM | POA: Diagnosis not present

## 2023-04-05 DIAGNOSIS — M9901 Segmental and somatic dysfunction of cervical region: Secondary | ICD-10-CM | POA: Diagnosis not present

## 2023-04-05 DIAGNOSIS — M50323 Other cervical disc degeneration at C6-C7 level: Secondary | ICD-10-CM | POA: Diagnosis not present

## 2023-04-23 DIAGNOSIS — I10 Essential (primary) hypertension: Secondary | ICD-10-CM | POA: Diagnosis not present

## 2023-04-23 DIAGNOSIS — R600 Localized edema: Secondary | ICD-10-CM | POA: Diagnosis not present

## 2023-04-23 DIAGNOSIS — M5416 Radiculopathy, lumbar region: Secondary | ICD-10-CM | POA: Diagnosis not present

## 2023-04-23 DIAGNOSIS — N400 Enlarged prostate without lower urinary tract symptoms: Secondary | ICD-10-CM | POA: Diagnosis not present

## 2023-04-23 DIAGNOSIS — E7800 Pure hypercholesterolemia, unspecified: Secondary | ICD-10-CM | POA: Diagnosis not present

## 2023-04-23 DIAGNOSIS — G4733 Obstructive sleep apnea (adult) (pediatric): Secondary | ICD-10-CM | POA: Diagnosis not present

## 2023-04-23 DIAGNOSIS — E1169 Type 2 diabetes mellitus with other specified complication: Secondary | ICD-10-CM | POA: Diagnosis not present

## 2023-04-23 DIAGNOSIS — Z9181 History of falling: Secondary | ICD-10-CM | POA: Diagnosis not present

## 2023-04-23 DIAGNOSIS — Z1331 Encounter for screening for depression: Secondary | ICD-10-CM | POA: Diagnosis not present

## 2023-04-23 DIAGNOSIS — M170 Bilateral primary osteoarthritis of knee: Secondary | ICD-10-CM | POA: Diagnosis not present

## 2023-04-23 DIAGNOSIS — J309 Allergic rhinitis, unspecified: Secondary | ICD-10-CM | POA: Diagnosis not present

## 2023-04-23 DIAGNOSIS — N281 Cyst of kidney, acquired: Secondary | ICD-10-CM | POA: Diagnosis not present

## 2023-05-15 DIAGNOSIS — M5124 Other intervertebral disc displacement, thoracic region: Secondary | ICD-10-CM | POA: Diagnosis not present

## 2023-05-15 DIAGNOSIS — M9902 Segmental and somatic dysfunction of thoracic region: Secondary | ICD-10-CM | POA: Diagnosis not present

## 2023-05-15 DIAGNOSIS — M50323 Other cervical disc degeneration at C6-C7 level: Secondary | ICD-10-CM | POA: Diagnosis not present

## 2023-05-15 DIAGNOSIS — M9901 Segmental and somatic dysfunction of cervical region: Secondary | ICD-10-CM | POA: Diagnosis not present

## 2023-05-15 DIAGNOSIS — M546 Pain in thoracic spine: Secondary | ICD-10-CM | POA: Diagnosis not present

## 2023-05-15 DIAGNOSIS — M62830 Muscle spasm of back: Secondary | ICD-10-CM | POA: Diagnosis not present

## 2023-05-15 DIAGNOSIS — M5413 Radiculopathy, cervicothoracic region: Secondary | ICD-10-CM | POA: Diagnosis not present

## 2023-05-22 DIAGNOSIS — L810 Postinflammatory hyperpigmentation: Secondary | ICD-10-CM | POA: Diagnosis not present

## 2023-05-22 DIAGNOSIS — L570 Actinic keratosis: Secondary | ICD-10-CM | POA: Diagnosis not present

## 2023-06-19 DIAGNOSIS — M50323 Other cervical disc degeneration at C6-C7 level: Secondary | ICD-10-CM | POA: Diagnosis not present

## 2023-06-19 DIAGNOSIS — M62830 Muscle spasm of back: Secondary | ICD-10-CM | POA: Diagnosis not present

## 2023-06-19 DIAGNOSIS — M5124 Other intervertebral disc displacement, thoracic region: Secondary | ICD-10-CM | POA: Diagnosis not present

## 2023-06-19 DIAGNOSIS — M5413 Radiculopathy, cervicothoracic region: Secondary | ICD-10-CM | POA: Diagnosis not present

## 2023-06-19 DIAGNOSIS — M9901 Segmental and somatic dysfunction of cervical region: Secondary | ICD-10-CM | POA: Diagnosis not present

## 2023-06-19 DIAGNOSIS — M546 Pain in thoracic spine: Secondary | ICD-10-CM | POA: Diagnosis not present

## 2023-06-19 DIAGNOSIS — M9902 Segmental and somatic dysfunction of thoracic region: Secondary | ICD-10-CM | POA: Diagnosis not present

## 2023-07-10 DIAGNOSIS — N281 Cyst of kidney, acquired: Secondary | ICD-10-CM | POA: Diagnosis not present

## 2023-07-10 DIAGNOSIS — R351 Nocturia: Secondary | ICD-10-CM | POA: Diagnosis not present

## 2023-07-10 DIAGNOSIS — N401 Enlarged prostate with lower urinary tract symptoms: Secondary | ICD-10-CM | POA: Diagnosis not present

## 2023-07-10 DIAGNOSIS — N3000 Acute cystitis without hematuria: Secondary | ICD-10-CM | POA: Diagnosis not present

## 2023-07-25 DIAGNOSIS — M546 Pain in thoracic spine: Secondary | ICD-10-CM | POA: Diagnosis not present

## 2023-07-25 DIAGNOSIS — M5124 Other intervertebral disc displacement, thoracic region: Secondary | ICD-10-CM | POA: Diagnosis not present

## 2023-07-25 DIAGNOSIS — M62830 Muscle spasm of back: Secondary | ICD-10-CM | POA: Diagnosis not present

## 2023-07-25 DIAGNOSIS — M50323 Other cervical disc degeneration at C6-C7 level: Secondary | ICD-10-CM | POA: Diagnosis not present

## 2023-07-25 DIAGNOSIS — M5413 Radiculopathy, cervicothoracic region: Secondary | ICD-10-CM | POA: Diagnosis not present

## 2023-07-25 DIAGNOSIS — M9902 Segmental and somatic dysfunction of thoracic region: Secondary | ICD-10-CM | POA: Diagnosis not present

## 2023-07-25 DIAGNOSIS — M9901 Segmental and somatic dysfunction of cervical region: Secondary | ICD-10-CM | POA: Diagnosis not present

## 2023-08-01 DIAGNOSIS — H2511 Age-related nuclear cataract, right eye: Secondary | ICD-10-CM | POA: Diagnosis not present

## 2023-08-01 DIAGNOSIS — Z7984 Long term (current) use of oral hypoglycemic drugs: Secondary | ICD-10-CM | POA: Diagnosis not present

## 2023-08-01 DIAGNOSIS — E119 Type 2 diabetes mellitus without complications: Secondary | ICD-10-CM | POA: Diagnosis not present

## 2023-08-01 DIAGNOSIS — H26492 Other secondary cataract, left eye: Secondary | ICD-10-CM | POA: Diagnosis not present

## 2023-08-28 DIAGNOSIS — Z125 Encounter for screening for malignant neoplasm of prostate: Secondary | ICD-10-CM | POA: Diagnosis not present

## 2023-08-28 DIAGNOSIS — M722 Plantar fascial fibromatosis: Secondary | ICD-10-CM | POA: Diagnosis not present

## 2023-08-28 DIAGNOSIS — E1169 Type 2 diabetes mellitus with other specified complication: Secondary | ICD-10-CM | POA: Diagnosis not present

## 2023-08-28 DIAGNOSIS — R600 Localized edema: Secondary | ICD-10-CM | POA: Diagnosis not present

## 2023-08-28 DIAGNOSIS — E7800 Pure hypercholesterolemia, unspecified: Secondary | ICD-10-CM | POA: Diagnosis not present

## 2023-08-28 DIAGNOSIS — N400 Enlarged prostate without lower urinary tract symptoms: Secondary | ICD-10-CM | POA: Diagnosis not present

## 2023-08-28 DIAGNOSIS — I10 Essential (primary) hypertension: Secondary | ICD-10-CM | POA: Diagnosis not present

## 2023-08-28 DIAGNOSIS — N281 Cyst of kidney, acquired: Secondary | ICD-10-CM | POA: Diagnosis not present

## 2023-08-28 DIAGNOSIS — M170 Bilateral primary osteoarthritis of knee: Secondary | ICD-10-CM | POA: Diagnosis not present

## 2023-08-28 DIAGNOSIS — G4733 Obstructive sleep apnea (adult) (pediatric): Secondary | ICD-10-CM | POA: Diagnosis not present

## 2023-08-28 DIAGNOSIS — I714 Abdominal aortic aneurysm, without rupture, unspecified: Secondary | ICD-10-CM | POA: Diagnosis not present

## 2023-08-28 DIAGNOSIS — J309 Allergic rhinitis, unspecified: Secondary | ICD-10-CM | POA: Diagnosis not present

## 2023-08-28 DIAGNOSIS — M5416 Radiculopathy, lumbar region: Secondary | ICD-10-CM | POA: Diagnosis not present

## 2023-09-04 ENCOUNTER — Ambulatory Visit (INDEPENDENT_AMBULATORY_CARE_PROVIDER_SITE_OTHER)

## 2023-09-04 ENCOUNTER — Ambulatory Visit (INDEPENDENT_AMBULATORY_CARE_PROVIDER_SITE_OTHER): Admitting: Podiatry

## 2023-09-04 DIAGNOSIS — M778 Other enthesopathies, not elsewhere classified: Secondary | ICD-10-CM

## 2023-09-04 DIAGNOSIS — M545 Low back pain, unspecified: Secondary | ICD-10-CM | POA: Diagnosis not present

## 2023-09-04 DIAGNOSIS — M722 Plantar fascial fibromatosis: Secondary | ICD-10-CM | POA: Diagnosis not present

## 2023-09-04 DIAGNOSIS — I10 Essential (primary) hypertension: Secondary | ICD-10-CM | POA: Diagnosis not present

## 2023-09-04 DIAGNOSIS — G8929 Other chronic pain: Secondary | ICD-10-CM | POA: Diagnosis not present

## 2023-09-04 DIAGNOSIS — M51369 Other intervertebral disc degeneration, lumbar region without mention of lumbar back pain or lower extremity pain: Secondary | ICD-10-CM | POA: Insufficient documentation

## 2023-09-04 NOTE — Progress Notes (Signed)
 Chief Complaint  Patient presents with   Plantar Fasciitis    Right heel pain, in the center of his heel and around the back of his heel for 2 weeks. Hurts real bad in the mornings, and after resting from activity. He looked for something with some arch support but has not found anything. Last A1c was 6.7 in July and no anti coag.    HPI: 73 y.o. diabetic male presenting today with c/o pain in the bottom of the right heel.  Denies injury or bruising.  He currently takes Celebrex 200 mg twice daily.  He also has been taking some extra strength Tylenol to help with the pain.  Symptoms have been present for 2 weeks.  He has pain with for steps out of bed in the morning.  He has Skechers shoes with removable insoles.  He has a history of multiple lower back surgeries and knee replacements.  He also has hypertension and occasionally has issues with swelling in his legs.  He does wear compression knee-high socks.  Past Medical History:  Diagnosis Date   Abdominal pain, unspecified site    Allergic rhinitis, cause unspecified    Anxiety state, unspecified    Contact dermatitis and other eczema, due to unspecified cause    Diverticulosis of colon (without mention of hemorrhage)    Edema    Esophageal reflux    Essential hypertension, benign    External hemorrhoids without mention of complication    Hypertrophy of prostate without urinary obstruction and other lower urinary tract symptoms (LUTS)    Irritable bowel syndrome    Lumbago    OSA on CPAP 09/30/2012   Osteoarthrosis, unspecified whether generalized or localized, lower leg    Other specified disorder of intestines    Pure hypercholesterolemia    Type II or unspecified type diabetes mellitus without mention of complication, not stated as uncontrolled    Unspecified sleep apnea    Varicose veins of lower extremities with inflammation    Past Surgical History:  Procedure Laterality Date   APPENDECTOMY     COLONOSCOPY W/ POLYPECTOMY   2005   KNEE ARTHROSCOPY Right 1984   LUMBAR LAMINECTOMY  1992 & 1996   Allergies  Allergen Reactions   Aspirin      Physical Exam: General: The patient is alert and oriented x3 in no acute distress.  Dermatology:  No ecchymosis, or erythema bilateral.  No open lesions.    Vascular: Palpable pedal pulses bilaterally. Capillary refill within normal limits.  +1 pitting edema bilateral lower legs and ankles.  Neurological: Epicritic sensation is intact  Musculoskeletal Exam:  There is pain on palpation of the plantarmedial & plantarcentral aspect of right heel.  No gaps or nodules within the plantar fascia.  Positive Windlass mechanism bilateral.  Antalgic gait noted with first few steps upon standing.  No pain on palpation of achilles tendon bilateral.  Ankle df less than 10 degrees with knee extended b/l.  Restricted first MPJ dorsiflexion on the right foot.  Palpable bony prominence on the dorsal aspect of the first MPJ on the right foot.  Radiographic Exam (right foot, 3 weightbearing views, 09/04/2023):  Normal osseous mineralization.  No fractures noted.  There is significant joint space narrowing at the first MPJ with dorsal spurring noted.  No calcaneal spur present.  Assessment/Plan of Care: 1. Plantar fasciitis of right foot   2. Capsulitis of right foot    FOR HOME USE ONLY DME NIGHT SPLINT FOR  HOME USE ONLY DME POWER STEP INSERTS  -Reviewed etiology of plantar fasciitis with patient.  Discussed treatment options with patient today, including cortisone injection, NSAID course of treatment, stretching exercises, physical therapy, use of night splint, rest, icing the heel, arch supports/orthotics, and supportive shoe gear.    With the patient's verbal consent, a corticosteroid injection was administered to the right heel, consisting of a mixture of 1% lidocaine plain, 0.5% Sensorcaine  plain, and Kenalog -10 for a total of 1.5cc administered.  A Band-aid was applied. Pain level  post-injection is 4/10.  Night splint fitted and dispensed today.  This is a static AFO device with soft interface material to be worn when sleeping or nonweightbearing.  Proof of delivery and insurance waiver signed via motion MD.  Stretching exercises printed and dispensed  Powerstep inserts fitted and dispensed  Return in about 4 weeks (around 10/02/2023) for f/u plantar fasciitis R foot.   Awanda CHARM Imperial, DPM, FACFAS [facility]     [address] [place], [place] [address]                Office [phone]  Fax [phone]

## 2023-09-04 NOTE — Patient Instructions (Signed)

## 2023-09-05 DIAGNOSIS — M62830 Muscle spasm of back: Secondary | ICD-10-CM | POA: Diagnosis not present

## 2023-09-05 DIAGNOSIS — M5413 Radiculopathy, cervicothoracic region: Secondary | ICD-10-CM | POA: Diagnosis not present

## 2023-09-05 DIAGNOSIS — M9902 Segmental and somatic dysfunction of thoracic region: Secondary | ICD-10-CM | POA: Diagnosis not present

## 2023-09-05 DIAGNOSIS — M9901 Segmental and somatic dysfunction of cervical region: Secondary | ICD-10-CM | POA: Diagnosis not present

## 2023-09-05 DIAGNOSIS — M5124 Other intervertebral disc displacement, thoracic region: Secondary | ICD-10-CM | POA: Diagnosis not present

## 2023-09-05 DIAGNOSIS — M50323 Other cervical disc degeneration at C6-C7 level: Secondary | ICD-10-CM | POA: Diagnosis not present

## 2023-09-05 DIAGNOSIS — M546 Pain in thoracic spine: Secondary | ICD-10-CM | POA: Diagnosis not present

## 2023-09-08 DIAGNOSIS — I10 Essential (primary) hypertension: Secondary | ICD-10-CM | POA: Insufficient documentation

## 2023-09-08 DIAGNOSIS — M545 Low back pain, unspecified: Secondary | ICD-10-CM | POA: Insufficient documentation

## 2023-10-02 ENCOUNTER — Ambulatory Visit (INDEPENDENT_AMBULATORY_CARE_PROVIDER_SITE_OTHER): Admitting: Podiatry

## 2023-10-02 DIAGNOSIS — M722 Plantar fascial fibromatosis: Secondary | ICD-10-CM

## 2023-10-02 MED ORDER — MELOXICAM 15 MG PO TABS: 15.0000 mg | ORAL_TABLET | Freq: Every day | ORAL | 0 refills | Status: AC

## 2023-10-02 NOTE — Progress Notes (Unsigned)
 Chief Complaint  Patient presents with   Plantar Fasciitis    Right foot PF pain, good days and bad. The last couple weeks he has had pain while driving. Fell Monday in the [place].  Unsure about injection. Foot turned red up his leg after the last injection. Blue night splint made him very hot and foot would itch, burn and get hot  A1c 6.4 No anti coag.    HPI: 73 y.o. male presents today for follow-up of right plantar fasciitis.  He is not sure if the night splint is causing overheating of his foot and leg and therefore some skin irritation.  He was not sure if this was from the splint or from his cortisone injection on his last visit.  He has not been performing the stretch exercises as often or thorough as recommended.  He has been taking ibuprofen/Advil as needed.  He has also taken Celebrex in the past.  He feels that the PowerStep inserts that were dispensed at our office did not support the arch well enough.  He is looking for something with a higher arch to wear.  Past Medical History:  Diagnosis Date   Abdominal pain, unspecified site    Allergic rhinitis, cause unspecified    Anxiety state, unspecified    Contact dermatitis and other eczema, due to unspecified cause    Diverticulosis of colon (without mention of hemorrhage)    Edema    Esophageal reflux    Essential hypertension, benign    External hemorrhoids without mention of complication    Hypertrophy of prostate without urinary obstruction and other lower urinary tract symptoms (LUTS)    Irritable bowel syndrome    Lumbago    OSA on CPAP 09/30/2012   Osteoarthrosis, unspecified whether generalized or localized, lower leg    Other specified disorder of intestines    Pure hypercholesterolemia    Type II or unspecified type diabetes mellitus without mention of complication, not stated as uncontrolled    Unspecified sleep apnea    Varicose veins of lower extremities with inflammation    Past Surgical History:   Procedure Laterality Date   APPENDECTOMY     COLONOSCOPY W/ POLYPECTOMY  2005   KNEE ARTHROSCOPY Right 1984   LUMBAR LAMINECTOMY  1992 & 1996   Allergies  Allergen Reactions   Aspirin     Physical Exam: Palpable pedal pulses.  No ecchymosis or erythema.  +1 pitting edema to the bilateral legs and ankles.  Ankle dorsiflexion is less than 10 degrees with the knee extended.  There is pain on palpation to the plantar central and plantar medial aspects of the right heel.  No Achilles pain.  No gaps or nodules within the plantar fascia or the Achilles tendon.  Epicritic sensation is intact.  Negative Tinel's sign posterior tibial nerve.  Assessment/Plan of Care: 1. Plantar fasciitis of right foot      Meds ordered this encounter  Medications   meloxicam  (MOBIC ) 15 MG tablet    Sig: Take 1 tablet (15 mg total) by mouth daily.    Dispense:  30 tablet    Refill:  0   Patient instructed to discontinue the night splint, or wear a sock to act as a proper on the skin to see if this prevents any skin irritation.  Instructed patient to perform stretching exercises at least 2-3 times every day.  If she does not feel he is able to complete this daily or have the motivation to perform these  on a regular basis, he can call in and we can refer him to formal physical therapy.  I will have him discontinue the over-the-counter ibuprofen/Advil and we will send in meloxicam  15 mg 1 tablet p.o. daily.  He will take it consistently for 1 week and then on an as needed basis.  Informed patient that there is a power step high arch version of the same insert that he is currently wearing.  We do not stock this in our office.  He can purchase this off of [employer] or through the power step website directly.  Informed the patient that it that insert also feels like it breaks down too quickly in the arch area, he would be better suited for custom molded orthotics.  He may need a thicker shell to support the  BMI.  Follow-up as needed  Awanda CHARM Imperial, DPM, FACFAS [facility]     [address] [place], [place] [address]                Office [phone]  Fax [phone]

## 2023-10-16 DIAGNOSIS — M5413 Radiculopathy, cervicothoracic region: Secondary | ICD-10-CM | POA: Diagnosis not present

## 2023-10-16 DIAGNOSIS — M546 Pain in thoracic spine: Secondary | ICD-10-CM | POA: Diagnosis not present

## 2023-10-16 DIAGNOSIS — M9902 Segmental and somatic dysfunction of thoracic region: Secondary | ICD-10-CM | POA: Diagnosis not present

## 2023-10-16 DIAGNOSIS — M9901 Segmental and somatic dysfunction of cervical region: Secondary | ICD-10-CM | POA: Diagnosis not present

## 2023-10-16 DIAGNOSIS — M62830 Muscle spasm of back: Secondary | ICD-10-CM | POA: Diagnosis not present

## 2023-10-16 DIAGNOSIS — M5124 Other intervertebral disc displacement, thoracic region: Secondary | ICD-10-CM | POA: Diagnosis not present

## 2023-10-16 DIAGNOSIS — M50323 Other cervical disc degeneration at C6-C7 level: Secondary | ICD-10-CM | POA: Diagnosis not present

## 2023-11-13 DIAGNOSIS — M170 Bilateral primary osteoarthritis of knee: Secondary | ICD-10-CM | POA: Diagnosis not present

## 2023-11-13 DIAGNOSIS — I10 Essential (primary) hypertension: Secondary | ICD-10-CM | POA: Diagnosis not present

## 2023-11-13 DIAGNOSIS — I714 Abdominal aortic aneurysm, without rupture, unspecified: Secondary | ICD-10-CM | POA: Diagnosis not present

## 2023-11-13 DIAGNOSIS — E7800 Pure hypercholesterolemia, unspecified: Secondary | ICD-10-CM | POA: Diagnosis not present

## 2023-11-13 DIAGNOSIS — Z23 Encounter for immunization: Secondary | ICD-10-CM | POA: Diagnosis not present

## 2023-11-13 DIAGNOSIS — N400 Enlarged prostate without lower urinary tract symptoms: Secondary | ICD-10-CM | POA: Diagnosis not present

## 2023-11-13 DIAGNOSIS — M5416 Radiculopathy, lumbar region: Secondary | ICD-10-CM | POA: Diagnosis not present

## 2023-11-13 DIAGNOSIS — G4733 Obstructive sleep apnea (adult) (pediatric): Secondary | ICD-10-CM | POA: Diagnosis not present

## 2023-11-13 DIAGNOSIS — J309 Allergic rhinitis, unspecified: Secondary | ICD-10-CM | POA: Diagnosis not present

## 2023-11-13 DIAGNOSIS — R600 Localized edema: Secondary | ICD-10-CM | POA: Diagnosis not present

## 2023-11-13 DIAGNOSIS — E1169 Type 2 diabetes mellitus with other specified complication: Secondary | ICD-10-CM | POA: Diagnosis not present

## 2023-12-11 DIAGNOSIS — M5413 Radiculopathy, cervicothoracic region: Secondary | ICD-10-CM | POA: Diagnosis not present

## 2023-12-11 DIAGNOSIS — M50323 Other cervical disc degeneration at C6-C7 level: Secondary | ICD-10-CM | POA: Diagnosis not present

## 2023-12-11 DIAGNOSIS — M5124 Other intervertebral disc displacement, thoracic region: Secondary | ICD-10-CM | POA: Diagnosis not present

## 2023-12-11 DIAGNOSIS — M546 Pain in thoracic spine: Secondary | ICD-10-CM | POA: Diagnosis not present

## 2023-12-11 DIAGNOSIS — M62830 Muscle spasm of back: Secondary | ICD-10-CM | POA: Diagnosis not present

## 2023-12-11 DIAGNOSIS — M9901 Segmental and somatic dysfunction of cervical region: Secondary | ICD-10-CM | POA: Diagnosis not present

## 2023-12-11 DIAGNOSIS — M9902 Segmental and somatic dysfunction of thoracic region: Secondary | ICD-10-CM | POA: Diagnosis not present

## 2023-12-31 DIAGNOSIS — N401 Enlarged prostate with lower urinary tract symptoms: Secondary | ICD-10-CM | POA: Diagnosis not present

## 2024-01-09 DIAGNOSIS — R059 Cough, unspecified: Secondary | ICD-10-CM | POA: Diagnosis not present

## 2024-01-09 DIAGNOSIS — R0981 Nasal congestion: Secondary | ICD-10-CM | POA: Diagnosis not present

## 2024-01-15 DIAGNOSIS — M9902 Segmental and somatic dysfunction of thoracic region: Secondary | ICD-10-CM | POA: Diagnosis not present

## 2024-01-15 DIAGNOSIS — M546 Pain in thoracic spine: Secondary | ICD-10-CM | POA: Diagnosis not present

## 2024-01-15 DIAGNOSIS — M50323 Other cervical disc degeneration at C6-C7 level: Secondary | ICD-10-CM | POA: Diagnosis not present

## 2024-01-15 DIAGNOSIS — M5124 Other intervertebral disc displacement, thoracic region: Secondary | ICD-10-CM | POA: Diagnosis not present

## 2024-01-15 DIAGNOSIS — M5413 Radiculopathy, cervicothoracic region: Secondary | ICD-10-CM | POA: Diagnosis not present

## 2024-01-15 DIAGNOSIS — M9901 Segmental and somatic dysfunction of cervical region: Secondary | ICD-10-CM | POA: Diagnosis not present

## 2024-01-15 DIAGNOSIS — M62830 Muscle spasm of back: Secondary | ICD-10-CM | POA: Diagnosis not present
